# Patient Record
Sex: Female | Born: 1940 | Race: White | Hispanic: No | Marital: Single | Smoking: Never smoker
Health system: Southern US, Community
[De-identification: ages and names within clinical notes are randomized; demographics above are authoritative.]

## PROBLEM LIST (undated history)

## (undated) DIAGNOSIS — E669 Obesity, unspecified: Secondary | ICD-10-CM

## (undated) DIAGNOSIS — I10 Essential (primary) hypertension: Secondary | ICD-10-CM

## (undated) DIAGNOSIS — E119 Type 2 diabetes mellitus without complications: Secondary | ICD-10-CM

## (undated) HISTORY — PX: CATARACT EXTRACTION: SUR2

## (undated) HISTORY — PX: TONSILLECTOMY: SHX5217

## (undated) HISTORY — DX: Essential (primary) hypertension: I10

## (undated) HISTORY — DX: Obesity, unspecified: E66.9

---

## 2007-11-10 ENCOUNTER — Encounter: Payer: Self-pay | Admitting: Internal Medicine

## 2007-12-20 ENCOUNTER — Encounter: Payer: Self-pay | Admitting: Internal Medicine

## 2008-01-03 ENCOUNTER — Telehealth: Payer: Self-pay | Admitting: Internal Medicine

## 2008-01-04 ENCOUNTER — Encounter: Payer: Self-pay | Admitting: Internal Medicine

## 2008-01-04 DIAGNOSIS — J984 Other disorders of lung: Secondary | ICD-10-CM

## 2008-01-19 ENCOUNTER — Encounter: Payer: Self-pay | Admitting: Internal Medicine

## 2008-02-04 DIAGNOSIS — I10 Essential (primary) hypertension: Secondary | ICD-10-CM | POA: Insufficient documentation

## 2008-02-15 ENCOUNTER — Ambulatory Visit: Payer: Self-pay | Admitting: Internal Medicine

## 2008-02-15 DIAGNOSIS — R0602 Shortness of breath: Secondary | ICD-10-CM

## 2008-02-15 DIAGNOSIS — R05 Cough: Secondary | ICD-10-CM

## 2008-03-30 ENCOUNTER — Ambulatory Visit: Payer: Self-pay | Admitting: Internal Medicine

## 2008-05-06 ENCOUNTER — Telehealth: Payer: Self-pay | Admitting: Internal Medicine

## 2009-03-12 ENCOUNTER — Other Ambulatory Visit: Admission: RE | Admit: 2009-03-12 | Discharge: 2009-03-12 | Payer: Self-pay | Admitting: Family Medicine

## 2011-02-18 ENCOUNTER — Ambulatory Visit (INDEPENDENT_AMBULATORY_CARE_PROVIDER_SITE_OTHER): Payer: 59 | Admitting: Internal Medicine

## 2011-02-18 ENCOUNTER — Encounter: Payer: Self-pay | Admitting: Internal Medicine

## 2011-02-18 DIAGNOSIS — J984 Other disorders of lung: Secondary | ICD-10-CM

## 2011-02-18 DIAGNOSIS — R05 Cough: Secondary | ICD-10-CM

## 2011-02-18 DIAGNOSIS — R059 Cough, unspecified: Secondary | ICD-10-CM

## 2011-02-18 DIAGNOSIS — I10 Essential (primary) hypertension: Secondary | ICD-10-CM

## 2011-02-25 NOTE — Assessment & Plan Note (Signed)
Summary: rov ///kp   Visit Type:  Follow-up Copy to:  DR. Darral Dash @ [facility] Primary Provider/Referring Provider:  Dr. Wynelle Link @ [facility]  CC:  Pt last seen 03/2008. Pt states she is still having sob but as bad as before. Pt c/o dry cough and feels it is getting worse and has a tickle in her throat. Sheri Moreno  History of Present Illness: February 18, 2011: Originally seen in March and AProl 2009 for cough and dyspnea (post viral, sinus drainage and ? element of asthma). Cough and dyspnea resolved with stoppingn fish oil, starting symbicort and veramyst. Had normal PFts. aFter that no show. Now returns with c/o cough for  1 year but also on lisinopril for 1 year for bp (was on lisinopril in 2008 and stopping it at time did not help resolve cough per records). Ocurs randomly. Day and night. Mild occ wheeze early in morning on occassion. Made worse by talking. No relieving factors. Denies sinus drainage but feels tickle down throat. Denies dyspnea, hemoptysis, sputum, edema, nausea, vomit, diarrhea.   Preventive Screening-Counseling & Management  Alcohol-Tobacco     Smoking Status: never     Passive Smoke Exposure: yes  Current Medications (verified): 1)  Lisinopril 20 Mg Tabs (Lisinopril) .... Once Daily 2)  Cal-A.e.p. 43.9 Mg  Caps (Calcium) .... Once Daily 3)  Symbicort 160-4.5 Mcg/act  Aero (Budesonide-Formoterol Fumarate) .... Two Times A Day 4)  Ventolin Hfa 108 (90 Base) Mcg/act  Aers (Albuterol Sulfate) .... 2 Puffs Every 4 Hrs As Needed 5)  Osteo Bi-Flex Joint Shield  Tabs (Misc Natural Products) .... Once Daily  Allergies (verified): 1)  ! Penicillin  Past History:  Past medical, surgical, family and social histories (including risk factors) reviewed, and no changes noted (except as noted below).  Past Medical History: Reviewed history from 02/15/2008 and no changes required. cataracts Hypertension Obesity - BMI 30 Cocci Pneumonia - 1985 when moveed to [place]  Past Surgical History: Reviewed  history from 02/15/2008 and no changes required. Cataract Extraction Tonsillectomy Cesarean section Hammer toe repair  Family History: Reviewed history from 02/15/2008 and no changes required. Pancreatic cancer: sister, uncle Stomach Cancer: father Lung cancer: aunt Hypertension: many in family Dementia: mom  Social History: Reviewed history from 02/15/2008 and no changes required. 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 Never smoked. Lives in [place] for past 6 months. Lives with daughter and family in [place]. Goes to [place], [place] when working on [employer] (greater part of last 2 years). Originally moved to [place] in [place]. Passive Smoker - dad and husband.  Review of Systems       The patient complains of shortness of breath with activity, non-productive cough, sore throat, and joint stiffness or pain.  The patient denies shortness of breath at rest, productive cough, coughing up blood, chest pain, irregular heartbeats, acid heartburn, indigestion, loss of appetite, weight change, abdominal pain, difficulty swallowing, tooth/dental problems, headaches, nasal congestion/difficulty breathing through nose, sneezing, itching, ear ache, anxiety, depression, hand/feet swelling, rash, change in color of mucus, and fever.    Vital Signs:  Patient profile:   70 year old female Height:      67 inches Weight:      191.25 pounds BMI:     30.06 O2 Sat:      96 % on Room air Temp:     97.4 degrees F oral Pulse rate:   70 / minute BP sitting:   124 / 78  (  left arm) Cuff size:   regular  Vitals Entered By: Carver Fila (February 18, 2011 1:38 PM)  O2 Flow:  Room air CC: Pt last seen 03/2008. Pt states she is still having sob but as bad as before. Pt c/o dry cough and feels it is getting worse, has a tickle in her throat.  Comments meds and allergies updated Phone number updated [facility]  February 18, 2011 1:42 PM    Physical Exam  General:  obese.   Head:   normocephalic and atraumatic Eyes:  PERRLA/EOM intact; conjunctiva and sclera clear Ears:  TMs intact and clear with normal canals Nose:  no deformity, discharge, inflammation, or lesions Mouth:  no deformity or lesionsMelampatti Class II.   Neck:  no masses, thyromegaly, or abnormal cervical nodes Chest Wall:  no deformities noted Lungs:  clear bilaterally to auscultation and percussion Heart:  regular rate and rhythm, S1, S2 without murmurs, rubs, gallops, or clicks Abdomen:  obese. soft. liver edge palpated.  Msk:  no deformity or scoliosis noted with normal posture Pulses:  pulses normal Extremities:  no clubbing, cyanosis, edema, or deformity noted Neurologic:  CN II-XII grossly intact with normal reflexes, coordination, muscle strength and tone Skin:  intact without lesions or rashes Cervical Nodes:  no significant adenopathy Psych:  alert and cooperative; normal mood and affect; normal attention span and concentration   Impression & Recommendations:  Problem # 1:  COUGH (ICD-786.2) Assessment Deteriorated  plan stop ace inhibitors rov 1 month then reassess for other etiologies  Orders: Prescription Created Electronically [phone]) Est. 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  PLAN re-address at fu  Orders: Prescription Created Electronically [phone]) Est. Patient Level III (91478)  Problem # 3:  HYPERTENSION (ICD-401.9) Assessment: Unchanged change lisinopril to samples of bystolic.    The following medications were removed from the medication list:    Lisinopril 20 Mg Tabs (Lisinopril) ..... Once daily Her updated medication list for this problem includes:    Bystolic 5 Mg Tabs (Nebivolol hcl) .Sheri Moreno... Take 1 tablet daily  Orders: Prescription Created Electronically [phone]) Est. Patient Level III (13086)  Medications Added to Medication List  This Visit: 1)  Lisinopril 20 Mg Tabs (Lisinopril) .... Once daily 2)  Ventolin Hfa 108 (90 Base) Mcg/act Aers (Albuterol sulfate) .... 2 puffs every 4 hrs as needed 3)  Osteo Bi-flex Joint Shield Tabs (Misc natural products) .... Once daily 4)  Bystolic 5 Mg Tabs (Nebivolol hcl) .... Take 1 tablet daily  Patient Instructions: 1)  stop lisinopril 2)  try to break your cough cycle with cough drops or swallowing saliva or drinking water 3)  return in 4 weeks Prescriptions: BYSTOLIC 5 MG TABS (NEBIVOLOL HCL) take 1 tablet daily  #30 x 1   Entered and Authorized by:   Kalman Shan MD   Signed by:   Kalman Shan MD on 02/18/2011   Method used:   Historical   RxID:   [id number]    Orders Added: 1)  Prescription Created Electronically [[id number]] 2)  Est. Patient Level III [44010]    Immunization History:  Influenza Immunization History:    Influenza:  historical (10/08/2010)

## 2011-03-13 ENCOUNTER — Encounter: Payer: Self-pay | Admitting: Adult Health

## 2011-03-17 ENCOUNTER — Encounter: Payer: Self-pay | Admitting: Adult Health

## 2011-03-17 ENCOUNTER — Ambulatory Visit (INDEPENDENT_AMBULATORY_CARE_PROVIDER_SITE_OTHER): Payer: 59 | Admitting: Adult Health

## 2011-03-17 DIAGNOSIS — R05 Cough: Secondary | ICD-10-CM

## 2011-03-17 DIAGNOSIS — J984 Other disorders of lung: Secondary | ICD-10-CM

## 2011-03-17 DIAGNOSIS — R059 Cough, unspecified: Secondary | ICD-10-CM

## 2011-03-17 MED ORDER — NEBIVOLOL HCL 5 MG PO TABS: 5.0000 mg | ORAL_TABLET | Freq: Every day | ORAL | Status: DC

## 2011-03-17 NOTE — Assessment & Plan Note (Addendum)
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  ?on return may be able to decrease symbicort dose or consider d/c.  Plan:   Goal is to stop coughing , throat clearing , Use water , ice chips, NO MINTS.  Avoid ACE inhibitors in future , Place on allergy list.  Mucinex DM Twice daily  For cough As needed   Loratadine daily for drainage As needed   Add Chlor Tabs 4mg  2 At bedtime  Until drainage /cough stops.  Prilosec 20mg  daily before meal x 4 weeks.  If cough returns restart this med until  Cough stops.  Make sure you brush/rinse and gargle after inhaler use.  follow up Dr. Marchelle Gearing in 6 weeks and As needed

## 2011-03-17 NOTE — Progress Notes (Signed)
  Subjective:    Patient ID: Sheri Moreno, female    DOB: 1941/09/22, 70 y.o.   MRN: [id number]  HPI 70 yo female pt of Dr. Marchelle Gearing seen initially for cyclical cough in 2009. Never Smoker.   February 18, 2011: Originally seen in March and AProl 2009 for cough and dyspnea (post viral, sinus drainage and ? element of asthma). Cough and dyspnea resolved with stoppingn fish oil, starting symbicort and veramyst. Had normal PFts. aFter that no show. Now returns with c/o cough for 1 year but also on lisinopril for 1 year for bp (was on lisinopril in 2008 and stopping it at time did not help resolve cough per records). Ocurs randomly. Day and night. Mild occ wheeze early in morning on occassion. Made worse by talking. No relieving factors. Denies sinus drainage but feels tickle down throat.>>Cough flare--ACE stopped, rx for bystolic  03/17/2011 Follow up OV- Pt returns for 1 month follow up of cough. Last visit pt with cough flare felt secondary to ACE inhibitor. Lisinopril was stopped and replaced with Bystolic. Since last ov she is improved with decreased cough. Cough is not totally gone but much better. She does have a lot post nasal drainage esp at night. No overt reflux.   Also has hx of RML 2-3 mm nodule on CT Jan 2009 at [facility]. She is a never smoker. Denies weight loss or hemoptysis. Unclear if she had follow up CT or not.     Review of Systems Constitutional:   No  weight loss, night sweats,  Fevers, chills, fatigue, or  lassitude.  HEENT:   No headaches,  Difficulty swallowing,  Tooth/dental problems, or  Sore throat,                CV:  No chest pain,  Orthopnea, PND, swelling in lower extremities, anasarca, dizziness, palpitations, syncope.   GI  No heartburn, indigestion, abdominal pain, nausea, vomiting, diarrhea, change in bowel habits, loss of appetite, bloody stools.   Resp: No shortness of breath with exertion or at rest.  No excess mucus, no productive cough,  No non-productive  cough,  No coughing up of blood.  No change in color of mucus.  No wheezing.  No chest wall deformity  Skin: no rash or lesions.  GU: no dysuria, change in color of urine, no urgency or frequency.  No flank pain, no hematuria   MS:  No joint pain or swelling.  No decreased range of motion.  No back pain.  Psych:  No change in mood or affect. No depression or anxiety.  No memory loss.    Objective:   Physical Exam GEN: A/Ox3; pleasant , NAD, well nourished   HEENT:  [place]/AT,  EACs-clear, TMs-wnl, NOSE-clear, THROAT-clear, no lesions, clear postnasal drip   NECK:  Supple w/ fair ROM; no JVD; normal carotid impulses w/o bruits; no thyromegaly or nodules palpated; no lymphadenopathy.  RESP  Clear  P & A; w/o, wheezes/ rales/ or rhonchi.no accessory muscle use, no dullness to percussion  CARD:  RRR, no m/r/g  , no peripheral edema, pulses intact, no cyanosis or clubbing.  GI:   Soft & nt; nml bowel sounds; no organomegaly or masses detected.  Musco: Warm bil, no deformities or joint swelling noted.   Neuro: alert, no focal deficits noted.    Skin: Warm, no lesions or rashes      Assessment & Plan:

## 2011-03-17 NOTE — Patient Instructions (Signed)
We think you have a cyclical cough that is aggravated by ACE inhibitors, drainage and reflux.  Goal is to stop coughing , throat clearing , Use water , ice chips, NO MINTS.  Avoid ACE inhibitors in future , Place on allergy list.  Mucinex DM Twice daily  For cough As needed   Loratadine daily for drainage As needed   Add Chlor Tabs 4mg  2 At bedtime  Until drainage /cough stops.  Prilosec 20mg  daily before meal x 4 weeks.  If cough returns restart this med until  Cough stops.  Make sure you brush/rinse and gargle after inhaler use.  follow up Dr. Marchelle Gearing in 6 weeks and As needed

## 2011-03-17 NOTE — Assessment & Plan Note (Signed)
Will discuss with Dr. Marchelle Gearing if he reviewed previous CT films in past  Informed pt we will call her or discuss at return ov if she needs this repeated.

## 2011-09-17 ENCOUNTER — Ambulatory Visit: Payer: 59 | Admitting: Internal Medicine

## 2011-09-29 ENCOUNTER — Encounter: Payer: Self-pay | Admitting: Internal Medicine

## 2011-09-29 ENCOUNTER — Ambulatory Visit (INDEPENDENT_AMBULATORY_CARE_PROVIDER_SITE_OTHER): Payer: Medicare Other | Admitting: Internal Medicine

## 2011-09-29 DIAGNOSIS — R0602 Shortness of breath: Secondary | ICD-10-CM

## 2011-09-29 DIAGNOSIS — R911 Solitary pulmonary nodule: Secondary | ICD-10-CM

## 2011-09-29 DIAGNOSIS — R05 Cough: Secondary | ICD-10-CM

## 2011-09-29 DIAGNOSIS — J984 Other disorders of lung: Secondary | ICD-10-CM

## 2011-09-29 NOTE — Assessment & Plan Note (Signed)
#  Shortness of breath - unclear etiology: ? Deconditoning, ? Obesity related, ? True desaturation  - unclear if you are dropping oxygen or not - please have full PFT breathing test - will reviwe CT chest being done for nodule; will get it as HRCT chest  - repeat walking desaturation test at next visit  #followup  - in 3 weeks or so with CT chest and PFT results

## 2011-09-29 NOTE — Patient Instructions (Signed)
#  pulmonary nodule  - cancer risk is low but due to prior nodule and passive smoking hx: please do High res. CT chest without contrast - fu lung nodule (prior CT with [facility] 2009)  #Cough - regarding cough: please stop symbicort  - I would like to see results of breathing test and ct chest before we delve further into cough  #Shortness of breath  - unclear if you are dropping oxygen or not - please have full PFT breathing test  - repeat walking desaturation test at next visit  #followup  - in 3 weeks or so with CT chest and PFT results

## 2011-09-29 NOTE — Progress Notes (Signed)
Subjective:    Patient ID: Sheri Moreno, female    DOB: 1941-02-17, 70 y.o.   MRN: [id number]  HPI 70 yo female pt of Dr. Marchelle Gearing seen initially for cyclical cough in 2009. Never Smoker.   February 18, 2011: Originally seen in March and AProl 2009 for cough and dyspnea (post viral, sinus drainage and ? element of asthma). Cough and dyspnea resolved with stoppingn fish oil, starting symbicort and veramyst. Had normal PFts. aFter that no show. Now returns with c/o cough for 1 year but also on lisinopril for 1 year for bp (was on lisinopril in 2008 and stopping it at time did not help resolve cough per records). Ocurs randomly. Day and night. Mild occ wheeze early in morning on occassion. Made worse by talking. No relieving factors. Denies sinus drainage but feels tickle down throat.>>Cough flare--ACE stopped, rx for bystolic  03/17/2011 Follow up OV- Pt returns for 1 month follow up of cough. Last visit pt with cough flare felt secondary to ACE inhibitor. Lisinopril was stopped and replaced with Bystolic. Since last ov she is improved with decreased cough. Cough is not totally gone but much better. She does have a lot post nasal drainage esp at night. No overt reflux. Also has hx of RML 2-3 mm nodule on CT Jan 2009 at [facility]. She is a never smoker. Denies weight loss or hemoptysis. Unclear if she had follow up CT or not.   REC We think you have a cyclical cough that is aggravated by ACE inhibitors, drainage and reflux.  Goal is to stop coughing , throat clearing , Use water , ice chips, NO MINTS.  Avoid ACE inhibitors in future , Place on allergy list.  Mucinex DM Twice daily For cough As needed  Loratadine daily for drainage As needed  Add Chlor Tabs 4mg  2 At bedtime Until drainage /cough stops.  Prilosec 20mg  daily before meal x 4 weeks. If cough returns restart this med until Cough stops.  Make sure you brush/rinse and gargle after inhaler use.  follow up Dr. Marchelle Gearing in 6 weeks and As needed     OV 09/29/11: Followup cough and pulm nodules. States that after stopping ace inhibitor 6 months ago cough improved 80%. LEft with residual cough. RSI score currently for this residual cough is 19 of 45 (c/w LPR). Reports moderate hoarsness of voice, excess mucus in throat/post nasal drop, mild-moderate coughing after lying down and difficulty swallowing and mild sensation of lump in throat, clearing of throat, choking episodes. 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 Cough improved by not talking. Not sure she is following sinus and GERD advice of last visit but still using symbicort which was originally started  For cough that developed post lisinopril.   In terms of nodule, she is a passive smoker and feels she needs a repeat ct chest  This visit, reporting new dyspnea. Insidipous onset few years ago with cough. With cough improvement dyspnea did not imprved. Exertional for moderate activities. Relieved by rest. Unable to participate playing with grandkids. Pulse ox 94% at PMD office and she is worried this is low. Denies associated chest pain. Rates dyspnea as mild-moderate. Walked her in office and she dropped to 88% at 2nd lap (185 feet  = 1 lap)  Past, Family, Social: reviewd, no changes noted   Review of Systems  Constitutional: Negative for fever and unexpected weight change.  HENT: Positive  for voice change. Negative for ear pain, nosebleeds, congestion, sore throat, rhinorrhea, sneezing, trouble swallowing, dental problem, postnasal drip and sinus pressure.   Eyes: Negative for redness and itching.  Respiratory: Positive for shortness of breath. Negative for cough, chest tightness and wheezing.   Cardiovascular: Negative for palpitations and leg swelling.  Gastrointestinal: Negative for nausea and vomiting.  Genitourinary: Negative for dysuria.  Musculoskeletal: Negative for joint swelling.  Skin: Negative  for rash.  Neurological: Negative for headaches.  Hematological: Does not bruise/bleed easily.  Psychiatric/Behavioral: Negative for dysphoric mood. The patient is not nervous/anxious.        Objective:   Physical Exam  Vitals reviewed. Constitutional: She is oriented to person, place, and time. She appears well-developed and well-nourished. No distress.       overweight  HENT:  Head: Normocephalic and atraumatic.  Right Ear: External ear normal.  Left Ear: External ear normal.  Mouth/Throat: Oropharynx is clear and moist. No oropharyngeal exudate.       Voice squeaks as she speaks  Eyes: Conjunctivae and EOM are normal. Pupils are equal, round, and reactive to light. Right eye exhibits no discharge. Left eye exhibits no discharge. No scleral icterus.  Neck: Normal range of motion. Neck supple. No JVD present. No tracheal deviation present. No thyromegaly present.  Cardiovascular: Normal rate, regular rhythm, normal heart sounds and intact distal pulses.  Exam reveals no gallop and no friction rub.   No murmur heard. Pulmonary/Chest: Effort normal and breath sounds normal. No respiratory distress. She has no wheezes. She has no rales. She exhibits no tenderness.  Abdominal: Soft. Bowel sounds are normal. She exhibits no distension and no mass. There is no tenderness. There is no rebound and no guarding.  Musculoskeletal: Normal range of motion. She exhibits no edema and no tenderness.  Lymphadenopathy:    She has no cervical adenopathy.  Neurological: She is alert and oriented to person, place, and time. She has normal reflexes. No cranial nerve deficit. She exhibits normal muscle tone. Coordination normal.  Skin: Skin is warm and dry. No rash noted. She is not diaphoretic. No erythema. No pallor.  Psychiatric: She has a normal mood and affect. Her behavior is normal. Judgment and thought content normal.         Assessment & Plan:

## 2011-09-29 NOTE — Assessment & Plan Note (Signed)
#  pulmonary nodule  - cancer risk is low but due to prior nodule and passive smoking hx: please do High res. CT chest without contrast - fu lung nodule (prior CT with [facility] 2009)

## 2011-09-29 NOTE — Assessment & Plan Note (Signed)
#  Cough - regarding cough: please stop symbicort  - I would like to see results of breathing test and ct chest before we delve further into cough though RSI score 19 and voice changes all suggest LPR cough. However, the desaturation with exertion behooves that one investigates PFT and CT chest first

## 2011-10-01 ENCOUNTER — Ambulatory Visit (INDEPENDENT_AMBULATORY_CARE_PROVIDER_SITE_OTHER)
Admission: RE | Admit: 2011-10-01 | Discharge: 2011-10-01 | Disposition: A | Discharge status: Home / Self Care | Payer: Medicare Other | Source: Ambulatory Visit | Attending: Internal Medicine | Admitting: Internal Medicine

## 2011-10-01 DIAGNOSIS — R0602 Shortness of breath: Secondary | ICD-10-CM

## 2011-10-13 ENCOUNTER — Ambulatory Visit (INDEPENDENT_AMBULATORY_CARE_PROVIDER_SITE_OTHER): Payer: Medicare Other | Admitting: Internal Medicine

## 2011-10-13 DIAGNOSIS — R0602 Shortness of breath: Secondary | ICD-10-CM

## 2011-10-13 NOTE — Progress Notes (Signed)
PFT done today. 

## 2011-10-21 ENCOUNTER — Telehealth: Payer: Self-pay | Admitting: Internal Medicine

## 2011-10-21 NOTE — Telephone Encounter (Signed)
LMTCBx1.[name], CMA  

## 2011-10-21 NOTE — Telephone Encounter (Signed)
Sheri Moreno,  Please let patient know that November 2012 pulmonary function test is normal. Also, has she dropped off her 2009 CAT scan of the chest from [facility] radiology. She was supposed to do that. Let me know.  Thanks,  MR

## 2011-10-23 NOTE — Telephone Encounter (Signed)
I spoke with the pt and advised of results. She has not gotten the CD-rom yet. She states she is out of town and will be back next week.  Carron Curie, CMA

## 2011-11-25 ENCOUNTER — Ambulatory Visit: Payer: Medicare Other | Admitting: Internal Medicine

## 2011-12-01 ENCOUNTER — Telehealth: Payer: Self-pay | Admitting: *Deleted

## 2011-12-01 NOTE — Telephone Encounter (Signed)
I spoke with the patient and states she will have to call back to r/s because she works out of town every other week and is not sure of her Jan schedule. Carron Curie, CMA

## 2011-12-01 NOTE — Telephone Encounter (Signed)
LMTCBx1 to r/s pt appt on 12-04-11 due to doctor canceling office. When pt returns call please see Tammy Earlene Plater or Victorino Dike. If they are not in the office then send call to Lawson Fiscal, she is also aware of available dates. Thanks. Carron Curie, CMA

## 2011-12-04 ENCOUNTER — Ambulatory Visit: Payer: Medicare Other | Admitting: Internal Medicine

## 2011-12-15 ENCOUNTER — Telehealth: Payer: Self-pay | Admitting: Allergy

## 2011-12-15 NOTE — Telephone Encounter (Signed)
[facility] Requesting rx  bystolic 5 mg # 30 Last fill 10-09-2011 Allergies  Allergen Reactions  . Ace Inhibitors Cough  . Penicillins Hives and Rash   Tammy is this ok to refll?   Thanks

## 2011-12-16 MED ORDER — NEBIVOLOL HCL 5 MG PO TABS: 5.0000 mg | ORAL_TABLET | Freq: Every day | ORAL | Status: DC

## 2011-12-16 NOTE — Telephone Encounter (Signed)
She can have 1 refill but she missed her ov and was suppose to have follow up to review CT scan results and follow up -please set this up with Dr. Marchelle Gearing  thanx tam

## 2011-12-16 NOTE — Telephone Encounter (Signed)
lmomtcb x1 for pt rx sent 

## 2011-12-30 ENCOUNTER — Telehealth: Payer: Self-pay | Admitting: Internal Medicine

## 2011-12-30 NOTE — Telephone Encounter (Signed)
I spoke with pt and she states she still has not heard from MR regarding her CT she dropped off 3 weeks ago as he requested. Pt states this was suppose to be compared to the 1 she had a year ago. She states also needs a statement from MR stating she does not have TB. She either wants MR's analysis of the CT or the cd-rom sent to her pcp for their analysis's. Please advise MR, thanks

## 2011-12-31 NOTE — Telephone Encounter (Signed)
Forwardin to [employer]. We need to search for CT

## 2011-12-31 NOTE — Telephone Encounter (Signed)
Triage,  I am sending this message to Carron Curie along with old message tha I hhave kept open since mid nov 2012 waiting for her CT that I do not see in my bag or office search today  Thanks MR

## 2012-01-01 NOTE — Telephone Encounter (Signed)
See phone note from 10-21-11. Carron Curie, CMA

## 2012-01-01 NOTE — Telephone Encounter (Signed)
MR does not have the CD-Rom. I cannot locate it either. I called [facility] in med rec and it has not been sent to be filed.  I called [facility] to see if they can re-send CD-rom to me. They have to check old system and call me back. I have also LMTCBx1 with the pt. Carron Curie, CMA

## 2012-01-01 NOTE — Telephone Encounter (Signed)
I spoke with TJ at [facility] radiology and she is mailing the cd-rom to my attention.  I LMTCBx1 to advise the pt.Jennifer Yancey Flemings, CMA

## 2012-01-05 NOTE — Telephone Encounter (Signed)
CD-ROm given to MR. Also the pt states she needs a letter stating that she does not have TB for her work. She states her PPD always shows positive so she needs to have a doctors letter stating she does not have the disease. Please advise. Carron Curie, CMA

## 2012-01-07 ENCOUNTER — Telehealth: Payer: Self-pay | Admitting: Internal Medicine

## 2012-01-07 DIAGNOSIS — R911 Solitary pulmonary nodule: Secondary | ICD-10-CM

## 2012-01-07 NOTE — Telephone Encounter (Signed)
Pt is requesting the results of the CT that she brought by. Have you looked at these yet? Please advise. Carron Curie, CMA

## 2012-01-07 NOTE — Telephone Encounter (Signed)
Reviewed ct scan of chest from 01/04/2008 from [facility] and compared to October 01, 2011 ct in our system. Much of it is same esp some of the scarring and LUL apical subpleural nodules. One spot in RML in 2009 is now resolved. Only new one now in Oct 2012 is RLL subpleural ground glass opacity. This is small. Needs fu CT without contrast in October 2013- I ordered it  Gave result over phone to patient  This is NOT TUBERCULOSIS. Please do a letter saying patient does not have pulmonary  tuberculosis based on history, physical exam and ct scan chest findings from 2009 Jan and 2012 October. Please send letter to patient  Ensure fu based on prior OV note

## 2012-01-09 ENCOUNTER — Encounter: Payer: Self-pay | Admitting: *Deleted

## 2012-01-09 ENCOUNTER — Ambulatory Visit: Payer: Medicare Other | Admitting: Internal Medicine

## 2012-01-09 NOTE — Telephone Encounter (Signed)
Letter done and per pt request it was mailed to her and faxed to her PCP. Carron Curie, CMA

## 2012-04-08 ENCOUNTER — Other Ambulatory Visit: Payer: Self-pay | Admitting: Adult Health

## 2012-06-09 DIAGNOSIS — I10 Essential (primary) hypertension: Secondary | ICD-10-CM | POA: Diagnosis not present

## 2012-06-09 DIAGNOSIS — Z1331 Encounter for screening for depression: Secondary | ICD-10-CM | POA: Diagnosis not present

## 2012-08-03 DIAGNOSIS — H26499 Other secondary cataract, unspecified eye: Secondary | ICD-10-CM | POA: Diagnosis not present

## 2012-09-23 DIAGNOSIS — H26499 Other secondary cataract, unspecified eye: Secondary | ICD-10-CM | POA: Diagnosis not present

## 2012-09-30 ENCOUNTER — Other Ambulatory Visit: Payer: Medicare Other

## 2012-10-15 DIAGNOSIS — Z23 Encounter for immunization: Secondary | ICD-10-CM | POA: Diagnosis not present

## 2012-11-03 DIAGNOSIS — H8110 Benign paroxysmal vertigo, unspecified ear: Secondary | ICD-10-CM | POA: Diagnosis not present

## 2012-11-03 DIAGNOSIS — H903 Sensorineural hearing loss, bilateral: Secondary | ICD-10-CM | POA: Diagnosis not present

## 2012-11-03 DIAGNOSIS — R42 Dizziness and giddiness: Secondary | ICD-10-CM | POA: Diagnosis not present

## 2012-11-23 ENCOUNTER — Telehealth: Payer: Self-pay | Admitting: Internal Medicine

## 2012-11-23 DIAGNOSIS — R911 Solitary pulmonary nodule: Secondary | ICD-10-CM

## 2012-11-23 NOTE — Telephone Encounter (Signed)
lmtcb x1 pt has not been seen since 09/29/11

## 2012-11-24 NOTE — Telephone Encounter (Signed)
MR, please advise if you would like [place] to set this up prior to patient seeing you in 12-2012. Thanks.

## 2012-11-25 NOTE — Telephone Encounter (Signed)
Pr phone note from 12-2011 MR recs were as follows: RAMASWAMY,MURALI, MD 01/07/2012 1:54 PM Signed  Reviewed ct scan of chest from 01/04/2008 [facility] and compared to October 01, 2011 ct in our system. Much of it is same esp some of the scarring and LUL apical subpleural nodules. One spot in RML in 2009 is now resolved. Only new one now in Oct 2012 is RLL subpleural ground glass opacity. This is small. Needs fu CT without contrast in October 2013- I ordered it    So pt does need CT before appt. CT ordered. Pt is aware.Carron Curie, CMA

## 2012-11-29 NOTE — Telephone Encounter (Signed)
Yes, see her wit fu ct

## 2012-12-24 ENCOUNTER — Inpatient Hospital Stay: Admission: RE | Admit: 2012-12-24 | Payer: Medicare Other | Source: Ambulatory Visit

## 2012-12-28 ENCOUNTER — Ambulatory Visit: Payer: Medicare Other | Admitting: Internal Medicine

## 2013-01-07 ENCOUNTER — Telehealth: Payer: Self-pay | Admitting: Internal Medicine

## 2013-01-07 NOTE — Telephone Encounter (Signed)
I spoke with pt and she had a message to call MR'S nurse. I do not see anything in EPIC. Please advise Sheri Moreno if you called pt thanks

## 2013-01-10 DIAGNOSIS — R7301 Impaired fasting glucose: Secondary | ICD-10-CM | POA: Diagnosis not present

## 2013-01-10 DIAGNOSIS — I10 Essential (primary) hypertension: Secondary | ICD-10-CM | POA: Diagnosis not present

## 2013-01-11 ENCOUNTER — Encounter: Payer: Self-pay | Admitting: *Deleted

## 2013-01-11 ENCOUNTER — Encounter: Payer: Self-pay | Admitting: Internal Medicine

## 2013-01-11 ENCOUNTER — Ambulatory Visit (INDEPENDENT_AMBULATORY_CARE_PROVIDER_SITE_OTHER)
Admission: RE | Admit: 2013-01-11 | Discharge: 2013-01-11 | Disposition: A | Discharge status: Home / Self Care | Payer: Managed Care, Other (non HMO) | Source: Ambulatory Visit | Attending: Internal Medicine | Admitting: Internal Medicine

## 2013-01-11 ENCOUNTER — Ambulatory Visit (INDEPENDENT_AMBULATORY_CARE_PROVIDER_SITE_OTHER): Payer: Managed Care, Other (non HMO) | Admitting: Internal Medicine

## 2013-01-11 VITALS — BP 120/78 | HR 65 | Temp 97.8°F | Ht 67.0 in | Wt 204.8 lb

## 2013-01-11 DIAGNOSIS — R05 Cough: Secondary | ICD-10-CM | POA: Diagnosis not present

## 2013-01-11 DIAGNOSIS — J984 Other disorders of lung: Secondary | ICD-10-CM

## 2013-01-11 DIAGNOSIS — R911 Solitary pulmonary nodule: Secondary | ICD-10-CM

## 2013-01-11 DIAGNOSIS — R0602 Shortness of breath: Secondary | ICD-10-CM | POA: Diagnosis not present

## 2013-01-11 DIAGNOSIS — R7611 Nonspecific reaction to tuberculin skin test without active tuberculosis: Secondary | ICD-10-CM | POA: Diagnosis not present

## 2013-01-11 NOTE — Patient Instructions (Addendum)
#  Shortness of breath  - probably due to weight . This is mild. Work on your fitness  #lung nodules  - stable since 2009 and 2012. No further followup  #Cough  - glad it resolved. Was due to ace inhibitor allergy. No further followup  #Followup  - no further followup

## 2013-01-11 NOTE — Assessment & Plan Note (Signed)
No longer an issue after she stopped her ACE inhibitors

## 2013-01-11 NOTE — Progress Notes (Signed)
Subjective:    Patient ID: Sheri Moreno, female    DOB: 10/17/1941, 72 y.o.   MRN: [id number]  HPI 72 yo female pt of Dr. Marchelle Gearing seen initially for cyclical cough in 2009. Never Smoker.   February 18, 2011: Originally seen in March and AProl 2009 for cough and dyspnea (post viral, sinus drainage and ? element of asthma). Cough and dyspnea resolved with stoppingn fish oil, starting symbicort and veramyst. Had normal PFts. aFter that no show. Now returns with c/o cough for 1 year but also on lisinopril for 1 year for bp (was on lisinopril in 2008 and stopping it at time did not help resolve cough per records). Ocurs randomly. Day and night. Mild occ wheeze early in morning on occassion. Made worse by talking. No relieving factors. Denies sinus drainage but feels tickle down throat.>>Cough flare--ACE stopped, rx for bystolic  03/17/2011 Follow up OV- Pt returns for 1 month follow up of cough. Last visit pt with cough flare felt secondary to ACE inhibitor. Lisinopril was stopped and replaced with Bystolic. Since last ov she is improved with decreased cough. Cough is not totally gone but much better. She does have a lot post nasal drainage esp at night. No overt reflux. Also has hx of RML 2-3 mm nodule on CT Jan 2009 at [facility]. She is a never smoker. Denies weight loss or hemoptysis. Unclear if she had follow up CT or not.   REC We think you have a cyclical cough that is aggravated by ACE inhibitors, drainage and reflux.  Goal is to stop coughing , throat clearing , Use water , ice chips, NO MINTS.  Avoid ACE inhibitors in future , Place on allergy list.  Mucinex DM Twice daily For cough As needed  Loratadine daily for drainage As needed  Add Chlor Tabs 4mg  2 At bedtime Until drainage /cough stops.  Prilosec 20mg  daily before meal x 4 weeks. If cough returns restart this med until Cough stops.  Make sure you brush/rinse and gargle after inhaler use.  follow up Dr. Marchelle Gearing in 6 weeks and As needed    OV 09/29/11: Followup cough and pulm nodules. States that after stopping ace inhibitor 6 months ago cough improved 80%. LEft with residual cough. RSI score currently for this residual cough is 19 of 45 (c/w LPR). Reports moderate hoarsness of voice, excess mucus in throat/post nasal drop, mild-moderate coughing after lying down and difficulty swallowing and mild sensation of lump in throat, clearing of throat, choking episodes. 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 Cough improved by not talking. Not sure she is following sinus and GERD advice of last visit but still using symbicort which was originally started  For cough that developed post lisinopril.   In terms of nodule, she is a passive smoker and feels she needs a repeat ct chest  This visit, reporting new dyspnea. Insidipous onset few years ago with cough. With cough improvement dyspnea did not imprved. Exertional for moderate activities. Relieved by rest. Unable to participate playing with grandkids. Pulse ox 94% at PMD office and she is worried this is low. Denies associated chest pain. Rates dyspnea as mild-moderate. Walked her in office and she dropped to 88% at 2nd lap (185 feet  = 1 lap)  Past, Family, Social: reviewd, no changes noted  #pulmonary nodule  - cancer risk is low but due to prior nodule and passive smoking hx:  please do High res. CT chest without contrast - fu lung nodule (prior CT with [facility] 2009)  #Cough  - regarding cough: please stop symbicort  - I would like to see results of breathing test and ct chest before we delve further into cough  #Shortness of breath  - unclear if you are dropping oxygen or not  - please have full PFT breathing test  - repeat walking desaturation test at next visit  #followup  - in 3 weeks or so with CT chest and PFT results   OV 01/11/2013  Fu after long time   - cough: resolved. Not an issue anymore.  Stopped aftrer she stopped ace inhibitor in 2012. Not on any kind of ongoing Rx for cough including nasal steroid or ics  - dyspnea: still present and presenbt for 8 years. Stable. Mild. Unchanged.  She does not see it as a problem. She sees it as part of normal ageing. She notices it on treadmill walk after 20 min or after rushing through airport. Marland Kitchen Dyspnea resolves at rest. No asscoiated chest pain. No wheezeing. Does not impact quality of life or work. ng. Her Body mass index is 32.08 kg/(m^2). Her PFT nov 2012 was normal with dlco 76%  - Lung nodule:  [place] no change in nodules OCt 2012 to 01/11/2013 x 16 months with largest nodule at 5mm. Te 2012 cone scan largely unchanged from [facility] ( Much of it is same esp some of the scarring and LUL apical subpleural nodules. One spot in RML in 2009 is now resolved. Only new one now in Oct 2012 is RLL subpleural ground glass opacity)    Ct Chest Wo Contrast  01/11/2013  *RADIOLOGY REPORT*  Clinical Data: Lung nodule.  Positive PPD.  CT CHEST WITHOUT CONTRAST  Technique:  Multidetector CT imaging of the chest was performed following the standard protocol without IV contrast.  Comparison: 10/01/2011  Findings: No thoracic adenopathy observed.  Calcified mitral valve noted.  No cardiomegaly.  The 0.5 x 0.4 cm right upper lobe pulmonary nodule appears stable.  On image 19 of series 3. Scarring anteriorly in the right upper lobe with adjacent nodularity likewise appears stable.  There is a 3 mm right middle lobe pulmonary nodule on image 34 of series 3 which appears stable.  Several additional right middle lobe small pulmonary nodules appear stable.  In the right lower lobe, a 0.5 x 0.4 cm nodule on image 33 of series 3 is unchanged.  A 3 mm nodule in the right lower lobe on image 41 of series 3 is likewise unchanged.  In the left upper lobe, a subpleural 6 mm nodule is partially calcified and unchanged.  Faint nodularity further  inferiorly in the left upper lobe appears stable, as does the mild lingular scarring.  Small left lower lobe nodules include a 4 mm nodule in the superior segment left lower lobe as well as several additional tiny nodules, all of which appears similar to prior.  This includes a somewhat clover shaped 4 mm nodule in the left lower lobe on image 39 of series 3.  No new nodules identified.  IMPRESSION:  1.  Bilateral pulmonary nodules are unchanged over the intervening 16 months. If the patient is not low risk for lung cancer, then no further imaging follow-up is required.  If the patient is at high risk for lung cancer, then follow-up CT chest in one year's time would be recommended.  This recommendation follows the consensus statement: Guidelines for Management of Small Pulmonary Nodules Detected on CT Scans: A Statement from the Fleischner Society as published in Radiology 2005; 237:395-400. 2.  Calcified mitral valve.   Original Report Authenticated By: Gaylyn Rong, M.D.        Review of Systems  Constitutional: Negative for fever and unexpected weight change.  HENT: Negative for ear pain, nosebleeds, congestion, sore throat, rhinorrhea, sneezing, trouble swallowing, dental problem, postnasal drip and sinus pressure.   Eyes: Negative for redness and itching.  Respiratory: Positive for shortness of breath. Negative for cough, chest tightness and wheezing.   Cardiovascular: Negative for palpitations and leg swelling.  Gastrointestinal: Negative for nausea and vomiting.  Genitourinary: Negative for dysuria.  Musculoskeletal: Negative for joint swelling.  Skin: Negative for rash.  Neurological: Negative for headaches.  Hematological: Does not bruise/bleed easily.  Psychiatric/Behavioral: Negative for dysphoric mood. The patient is not nervous/anxious.        Objective:   Physical Exam Vitals reviewed. Constitutional: She is oriented to person, place, and time. She appears well-developed  and well-nourished. No distress.       overweight  HENT:  Head: Normocephalic and atraumatic.  Right Ear: External ear normal.  Left Ear: External ear normal.  Mouth/Throat: Oropharynx is clear and moist. No oropharyngeal exudate.         Eyes: Conjunctivae and EOM are normal. Pupils are equal, round, and reactive to light. Right eye exhibits no discharge. Left eye exhibits no discharge. No scleral icterus.  Neck: Normal range of motion. Neck supple. No JVD present. No tracheal deviation present. No thyromegaly present.  Cardiovascular: Normal rate, regular rhythm, normal heart sounds and intact distal pulses.  Exam reveals no gallop and no friction rub.   No murmur heard. Pulmonary/Chest: Effort normal and breath sounds normal. No respiratory distress. She has no wheezes. She has no rales. She exhibits no tenderness.  Abdominal: Soft. Bowel sounds are normal. She exhibits no distension and no mass. There is no tenderness. There is no rebound and no guarding.  Musculoskeletal: Normal range of motion. She exhibits no edema and no tenderness.  Lymphadenopathy:    She has no cervical adenopathy.  Neurological: She is alert and oriented to person, place, and time. She has normal reflexes. No cranial nerve deficit. She exhibits normal muscle tone. Coordination normal.  Skin: Skin is warm and dry. No rash noted. She is not diaphoretic. No erythema. No pallor.  Psychiatric: She has a normal mood and affect. Her behavior is normal. Judgment and thought content normal.         Assessment & Plan:

## 2013-01-11 NOTE — Assessment & Plan Note (Signed)
RML 2-3 mm nodule on CT Jan 2009 at [facility].>> Some 5 mm nodule oct 2012 unchanged Unchangd nodules Feb 2014  nO FURTHER followup because she is non-smoker

## 2013-01-11 NOTE — Assessment & Plan Note (Signed)
Is mild and stable for many years. Appears related to obesity and deconditioning asked her to work on a fitness. PFT November 2012t and test and CT scan are essentially normal and do not indicate any pulmonary issues for dyspnea

## 2013-01-12 DIAGNOSIS — H40009 Preglaucoma, unspecified, unspecified eye: Secondary | ICD-10-CM | POA: Diagnosis not present

## 2013-01-12 NOTE — Telephone Encounter (Signed)
We saw the pt on 01-11-13, nothing further needed.Carron Curie, CMA

## 2013-02-14 ENCOUNTER — Other Ambulatory Visit: Payer: Self-pay

## 2013-02-14 DIAGNOSIS — Z1231 Encounter for screening mammogram for malignant neoplasm of breast: Secondary | ICD-10-CM

## 2013-03-07 DIAGNOSIS — R7301 Impaired fasting glucose: Secondary | ICD-10-CM | POA: Diagnosis not present

## 2013-03-07 DIAGNOSIS — E7800 Pure hypercholesterolemia, unspecified: Secondary | ICD-10-CM | POA: Diagnosis not present

## 2013-03-09 ENCOUNTER — Ambulatory Visit
Admission: RE | Admit: 2013-03-09 | Discharge: 2013-03-09 | Disposition: A | Discharge status: Home / Self Care | Payer: Managed Care, Other (non HMO) | Source: Ambulatory Visit

## 2013-03-09 DIAGNOSIS — Z1231 Encounter for screening mammogram for malignant neoplasm of breast: Secondary | ICD-10-CM

## 2013-03-22 ENCOUNTER — Other Ambulatory Visit: Payer: Self-pay | Admitting: Family Medicine

## 2013-03-22 DIAGNOSIS — R928 Other abnormal and inconclusive findings on diagnostic imaging of breast: Secondary | ICD-10-CM

## 2013-04-18 ENCOUNTER — Ambulatory Visit
Admission: RE | Admit: 2013-04-18 | Discharge: 2013-04-18 | Disposition: A | Discharge status: Home / Self Care | Payer: Managed Care, Other (non HMO) | Source: Ambulatory Visit | Attending: Family Medicine | Admitting: Family Medicine

## 2013-04-18 DIAGNOSIS — R928 Other abnormal and inconclusive findings on diagnostic imaging of breast: Secondary | ICD-10-CM

## 2013-04-18 DIAGNOSIS — H40009 Preglaucoma, unspecified, unspecified eye: Secondary | ICD-10-CM | POA: Diagnosis not present

## 2013-04-21 DIAGNOSIS — K620 Anal polyp: Secondary | ICD-10-CM | POA: Diagnosis not present

## 2013-04-21 DIAGNOSIS — K621 Rectal polyp: Secondary | ICD-10-CM | POA: Diagnosis not present

## 2013-07-11 DIAGNOSIS — R7301 Impaired fasting glucose: Secondary | ICD-10-CM | POA: Diagnosis not present

## 2013-07-11 DIAGNOSIS — B351 Tinea unguium: Secondary | ICD-10-CM | POA: Diagnosis not present

## 2013-07-11 DIAGNOSIS — R197 Diarrhea, unspecified: Secondary | ICD-10-CM | POA: Diagnosis not present

## 2013-07-11 DIAGNOSIS — I10 Essential (primary) hypertension: Secondary | ICD-10-CM | POA: Diagnosis not present

## 2014-01-09 DIAGNOSIS — B351 Tinea unguium: Secondary | ICD-10-CM | POA: Diagnosis not present

## 2014-01-09 DIAGNOSIS — I10 Essential (primary) hypertension: Secondary | ICD-10-CM | POA: Diagnosis not present

## 2014-01-09 DIAGNOSIS — Z1331 Encounter for screening for depression: Secondary | ICD-10-CM | POA: Diagnosis not present

## 2014-01-09 DIAGNOSIS — L600 Ingrowing nail: Secondary | ICD-10-CM | POA: Diagnosis not present

## 2014-01-09 DIAGNOSIS — E782 Mixed hyperlipidemia: Secondary | ICD-10-CM | POA: Diagnosis not present

## 2014-01-23 DIAGNOSIS — M79609 Pain in unspecified limb: Secondary | ICD-10-CM | POA: Diagnosis not present

## 2014-01-27 DIAGNOSIS — L03039 Cellulitis of unspecified toe: Secondary | ICD-10-CM | POA: Diagnosis not present

## 2014-01-27 DIAGNOSIS — L02619 Cutaneous abscess of unspecified foot: Secondary | ICD-10-CM | POA: Diagnosis not present

## 2014-02-06 DIAGNOSIS — L259 Unspecified contact dermatitis, unspecified cause: Secondary | ICD-10-CM | POA: Diagnosis not present

## 2014-02-09 DIAGNOSIS — L259 Unspecified contact dermatitis, unspecified cause: Secondary | ICD-10-CM | POA: Diagnosis not present

## 2014-02-13 DIAGNOSIS — T8189XA Other complications of procedures, not elsewhere classified, initial encounter: Secondary | ICD-10-CM | POA: Diagnosis not present

## 2014-04-03 DIAGNOSIS — K921 Melena: Secondary | ICD-10-CM | POA: Diagnosis not present

## 2014-04-03 DIAGNOSIS — R197 Diarrhea, unspecified: Secondary | ICD-10-CM | POA: Diagnosis not present

## 2014-04-03 DIAGNOSIS — K649 Unspecified hemorrhoids: Secondary | ICD-10-CM | POA: Diagnosis not present

## 2014-04-04 DIAGNOSIS — R197 Diarrhea, unspecified: Secondary | ICD-10-CM | POA: Diagnosis not present

## 2014-04-14 DIAGNOSIS — Z961 Presence of intraocular lens: Secondary | ICD-10-CM | POA: Diagnosis not present

## 2014-04-14 DIAGNOSIS — H40019 Open angle with borderline findings, low risk, unspecified eye: Secondary | ICD-10-CM | POA: Diagnosis not present

## 2014-05-08 DIAGNOSIS — M79609 Pain in unspecified limb: Secondary | ICD-10-CM | POA: Diagnosis not present

## 2014-06-16 DIAGNOSIS — R197 Diarrhea, unspecified: Secondary | ICD-10-CM | POA: Diagnosis not present

## 2014-07-10 DIAGNOSIS — I10 Essential (primary) hypertension: Secondary | ICD-10-CM | POA: Diagnosis not present

## 2014-07-10 DIAGNOSIS — E782 Mixed hyperlipidemia: Secondary | ICD-10-CM | POA: Diagnosis not present

## 2014-07-10 DIAGNOSIS — R197 Diarrhea, unspecified: Secondary | ICD-10-CM | POA: Diagnosis not present

## 2014-07-10 DIAGNOSIS — R7301 Impaired fasting glucose: Secondary | ICD-10-CM | POA: Diagnosis not present

## 2014-07-20 ENCOUNTER — Other Ambulatory Visit: Payer: Self-pay | Admitting: Gastroenterology

## 2014-07-20 DIAGNOSIS — R197 Diarrhea, unspecified: Secondary | ICD-10-CM | POA: Diagnosis not present

## 2014-07-31 ENCOUNTER — Ambulatory Visit
Admission: RE | Admit: 2014-07-31 | Discharge: 2014-07-31 | Disposition: A | Discharge status: Home / Self Care | Payer: Medicare Other | Source: Ambulatory Visit | Attending: Gastroenterology | Admitting: Gastroenterology

## 2014-07-31 DIAGNOSIS — R197 Diarrhea, unspecified: Secondary | ICD-10-CM

## 2014-08-07 DIAGNOSIS — M79609 Pain in unspecified limb: Secondary | ICD-10-CM | POA: Diagnosis not present

## 2014-08-07 DIAGNOSIS — B351 Tinea unguium: Secondary | ICD-10-CM | POA: Diagnosis not present

## 2014-09-15 DIAGNOSIS — Z23 Encounter for immunization: Secondary | ICD-10-CM | POA: Diagnosis not present

## 2014-09-27 DIAGNOSIS — Z23 Encounter for immunization: Secondary | ICD-10-CM | POA: Diagnosis not present

## 2014-10-16 DIAGNOSIS — L270 Generalized skin eruption due to drugs and medicaments taken internally: Secondary | ICD-10-CM | POA: Diagnosis not present

## 2015-02-09 DIAGNOSIS — E782 Mixed hyperlipidemia: Secondary | ICD-10-CM | POA: Diagnosis not present

## 2015-02-09 DIAGNOSIS — M8580 Other specified disorders of bone density and structure, unspecified site: Secondary | ICD-10-CM | POA: Diagnosis not present

## 2015-02-09 DIAGNOSIS — Z1389 Encounter for screening for other disorder: Secondary | ICD-10-CM | POA: Diagnosis not present

## 2015-02-09 DIAGNOSIS — E119 Type 2 diabetes mellitus without complications: Secondary | ICD-10-CM | POA: Diagnosis not present

## 2015-02-09 DIAGNOSIS — I10 Essential (primary) hypertension: Secondary | ICD-10-CM | POA: Diagnosis not present

## 2015-02-26 DIAGNOSIS — M79675 Pain in left toe(s): Secondary | ICD-10-CM | POA: Diagnosis not present

## 2015-02-26 DIAGNOSIS — B351 Tinea unguium: Secondary | ICD-10-CM | POA: Diagnosis not present

## 2015-02-26 DIAGNOSIS — M79674 Pain in right toe(s): Secondary | ICD-10-CM | POA: Diagnosis not present

## 2015-03-07 ENCOUNTER — Other Ambulatory Visit (HOSPITAL_COMMUNITY): Payer: Self-pay | Admitting: Family Medicine

## 2015-03-07 DIAGNOSIS — Z1231 Encounter for screening mammogram for malignant neoplasm of breast: Secondary | ICD-10-CM

## 2015-03-09 ENCOUNTER — Ambulatory Visit (HOSPITAL_COMMUNITY)
Admission: RE | Admit: 2015-03-09 | Discharge: 2015-03-09 | Disposition: A | Discharge status: Home / Self Care | Payer: BLUE CROSS/BLUE SHIELD | Source: Ambulatory Visit | Attending: Family Medicine | Admitting: Family Medicine

## 2015-03-09 DIAGNOSIS — Z1231 Encounter for screening mammogram for malignant neoplasm of breast: Secondary | ICD-10-CM | POA: Diagnosis not present

## 2015-04-17 DIAGNOSIS — H40003 Preglaucoma, unspecified, bilateral: Secondary | ICD-10-CM | POA: Diagnosis not present

## 2015-04-17 DIAGNOSIS — H43813 Vitreous degeneration, bilateral: Secondary | ICD-10-CM | POA: Diagnosis not present

## 2015-08-15 DIAGNOSIS — Z23 Encounter for immunization: Secondary | ICD-10-CM | POA: Diagnosis not present

## 2015-08-15 DIAGNOSIS — M79674 Pain in right toe(s): Secondary | ICD-10-CM | POA: Diagnosis not present

## 2015-08-15 DIAGNOSIS — B351 Tinea unguium: Secondary | ICD-10-CM | POA: Diagnosis not present

## 2015-08-15 DIAGNOSIS — L609 Nail disorder, unspecified: Secondary | ICD-10-CM | POA: Diagnosis not present

## 2015-08-15 DIAGNOSIS — M79675 Pain in left toe(s): Secondary | ICD-10-CM | POA: Diagnosis not present

## 2015-08-17 DIAGNOSIS — E119 Type 2 diabetes mellitus without complications: Secondary | ICD-10-CM | POA: Diagnosis not present

## 2015-08-17 DIAGNOSIS — I10 Essential (primary) hypertension: Secondary | ICD-10-CM | POA: Diagnosis not present

## 2015-08-17 DIAGNOSIS — E782 Mixed hyperlipidemia: Secondary | ICD-10-CM | POA: Diagnosis not present

## 2015-10-01 IMAGING — US US ABDOMEN COMPLETE
1 series · 14 of 25 positions shown · non-contrast
Comparison: None.

CLINICAL DATA: Diarrhea

EXAM:
ULTRASOUND ABDOMEN COMPLETE

[Series 1: us abdomen complete · 0.37mm/px · 14 of 83 slices shown]
[im 1/83]
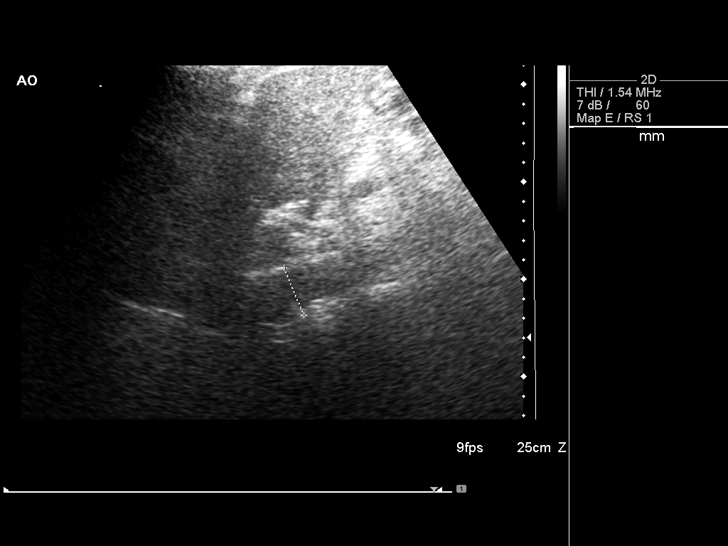
[im 7/83]
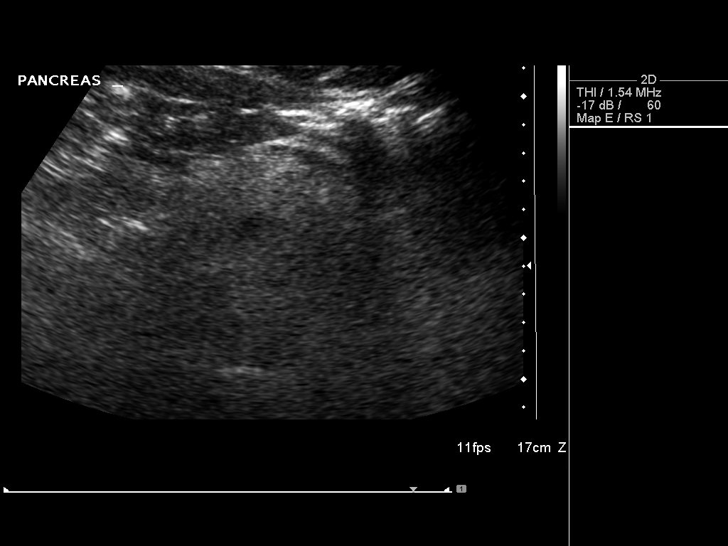
[im 14/83]
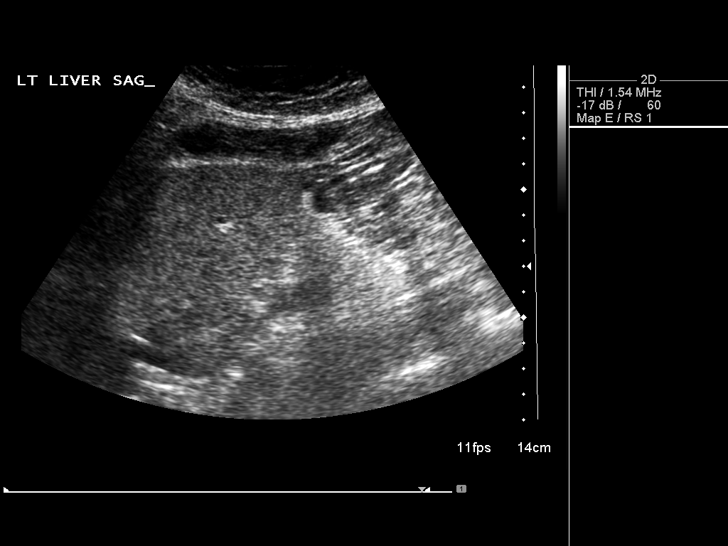
[im 21/83]
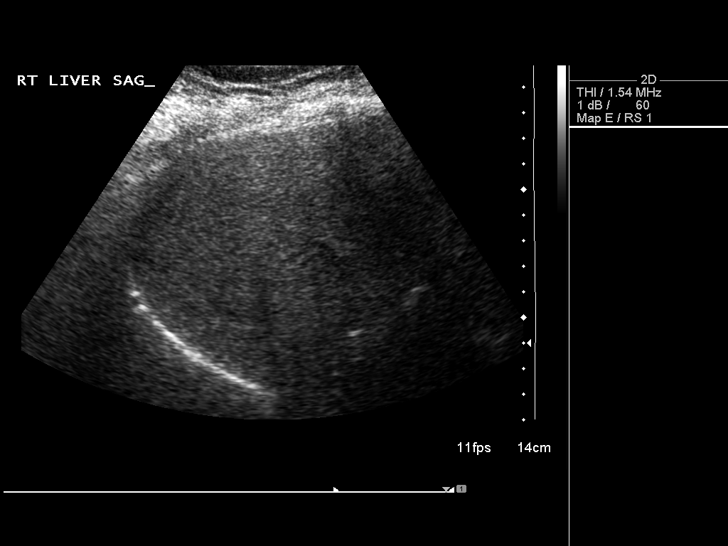
[im 28/83]
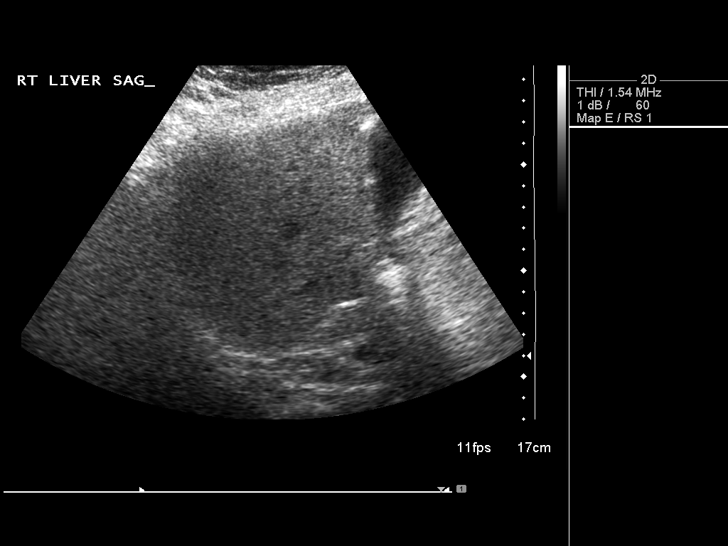
[im 31/83]
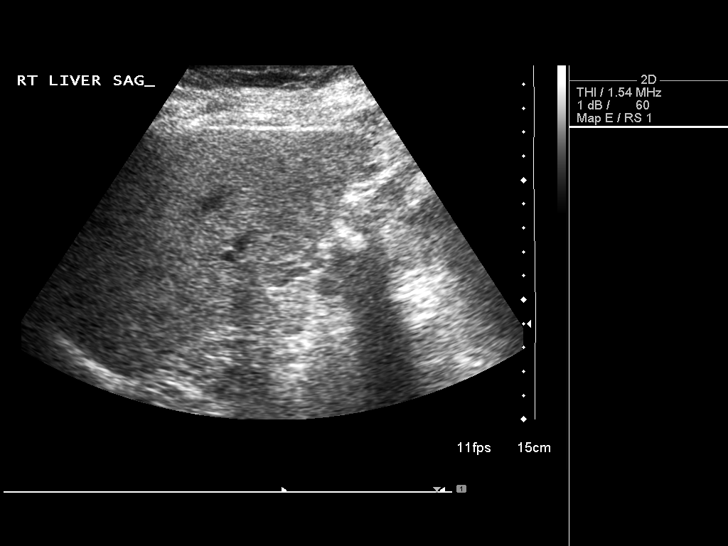
[im 38/83]
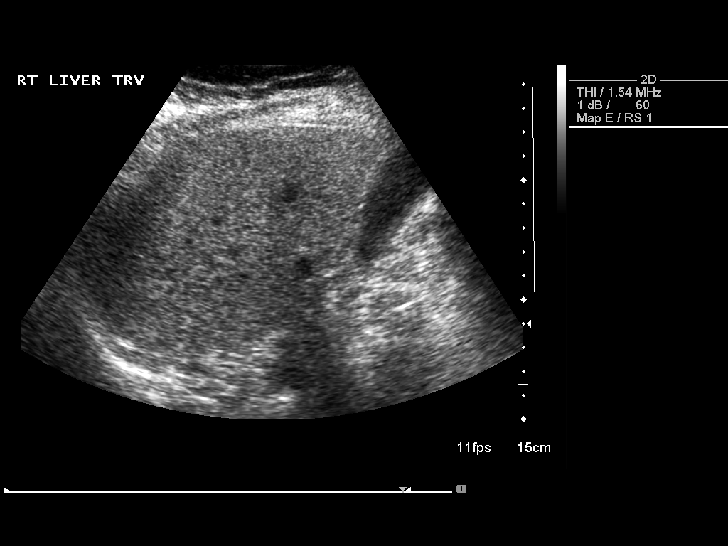
[im 45/83]
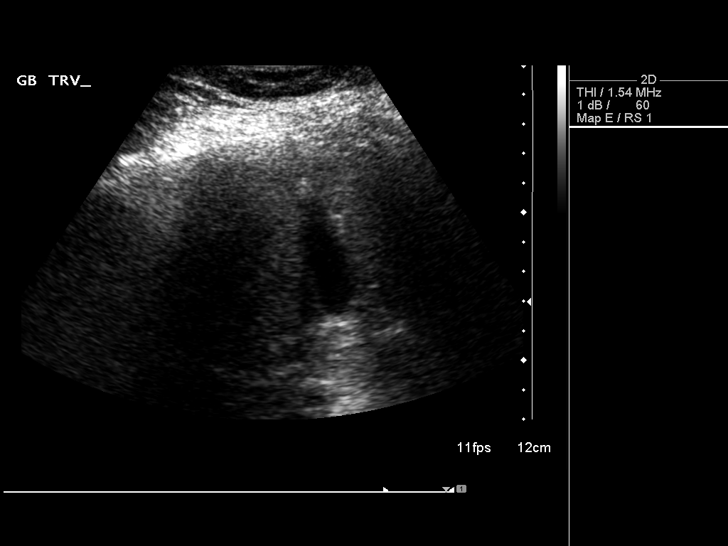
[im 52/83]
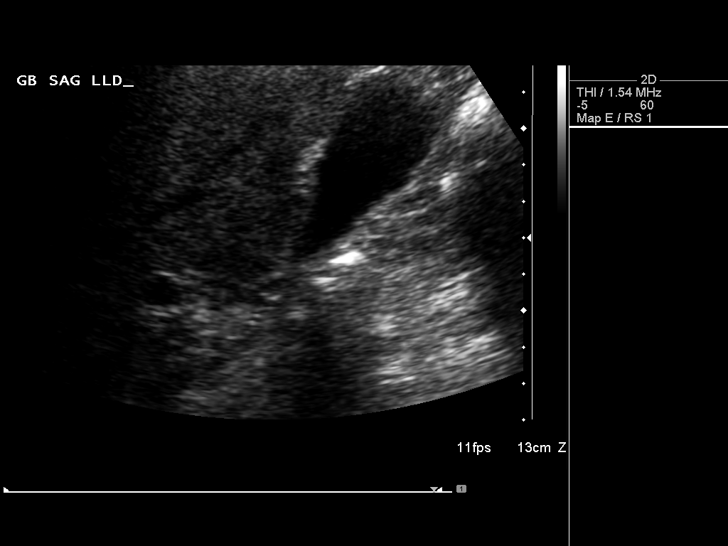
[im 55/83]
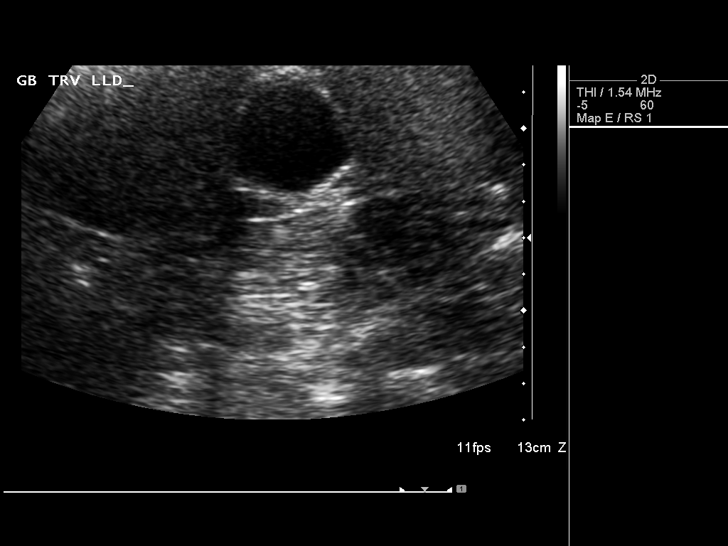
[im 62/83]
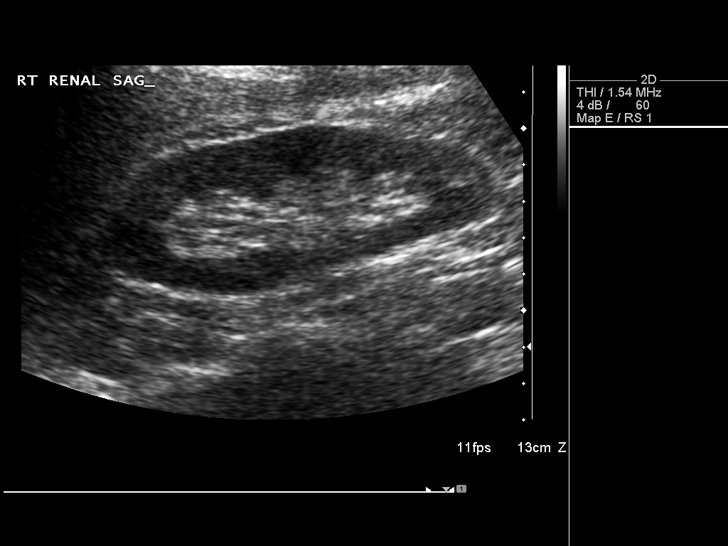
[im 69/83]
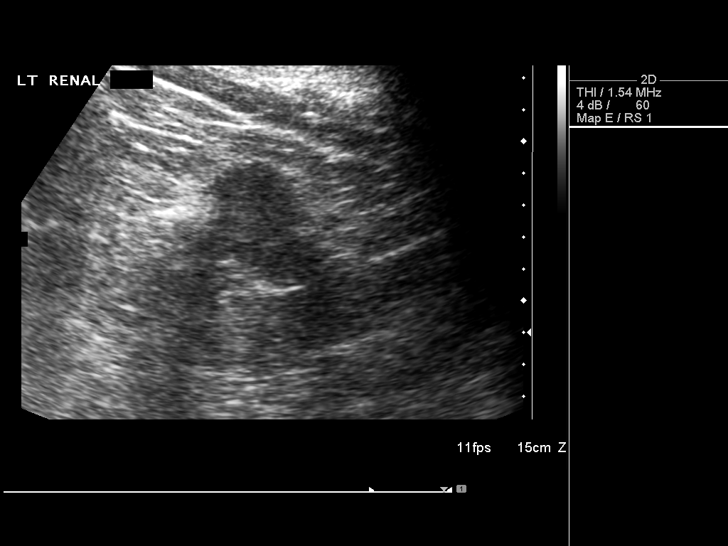
[im 76/83]
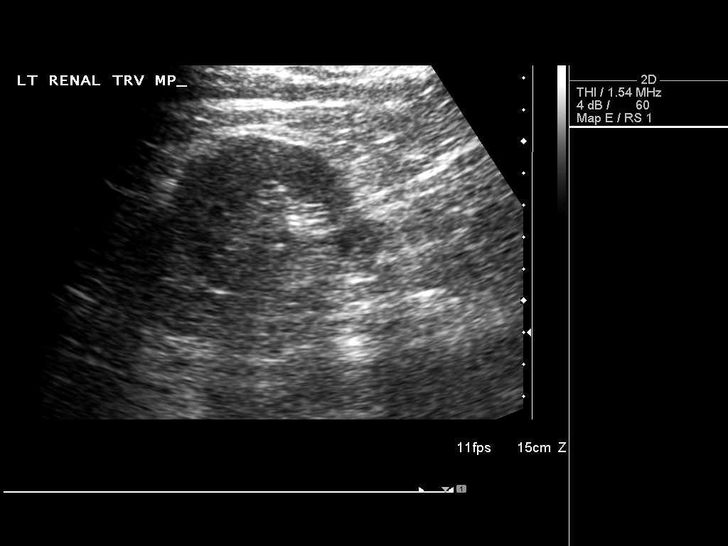
[im 83/83]
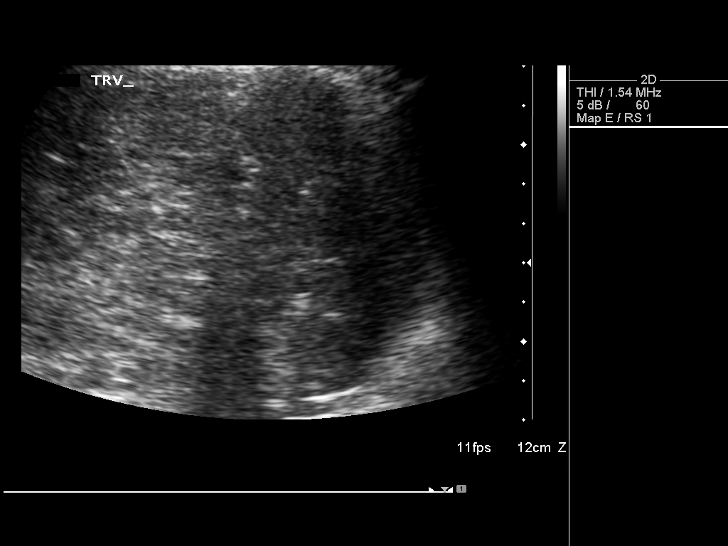

[14 of 25 positions shown; findings below may reference images not displayed]

FINDINGS: Gallbladder:

No gallstones or wall thickening visualized. No sonographic Murphy
sign noted.

Common bile duct:

Diameter: Normal at 4 mm

Liver:

Liver is uniformly increased in echogenicity. No biliary duct
dilatation. Questionable nodular contour to liver margin.

IVC:

No abnormality visualized.

Pancreas:

Visualized portion unremarkable.

Spleen:

Size and appearance within normal limits.

Right Kidney:

Length: 10.9 cm. Echogenicity within normal limits. No mass or
hydronephrosis visualized.

Left Kidney:

Length: 10.9 cm. Echogenicity within normal limits. No mass or
hydronephrosis visualized.

Abdominal aorta:

No aneurysm visualized.

Other findings:

No ascites
IMPRESSION: 1. Normal gallbladder.
2. Echogenic liver with differential including hepatic steatosis
versus hepatocellular disease.
3. Potential nodular contour of the liver.
4. No ascites or biliary duct dilatation

## 2016-02-15 DIAGNOSIS — I10 Essential (primary) hypertension: Secondary | ICD-10-CM | POA: Diagnosis not present

## 2016-02-15 DIAGNOSIS — E782 Mixed hyperlipidemia: Secondary | ICD-10-CM | POA: Diagnosis not present

## 2016-02-15 DIAGNOSIS — Z1389 Encounter for screening for other disorder: Secondary | ICD-10-CM | POA: Diagnosis not present

## 2016-02-15 DIAGNOSIS — E119 Type 2 diabetes mellitus without complications: Secondary | ICD-10-CM | POA: Diagnosis not present

## 2016-02-15 DIAGNOSIS — M8588 Other specified disorders of bone density and structure, other site: Secondary | ICD-10-CM | POA: Diagnosis not present

## 2016-04-16 DIAGNOSIS — M859 Disorder of bone density and structure, unspecified: Secondary | ICD-10-CM | POA: Diagnosis not present

## 2016-04-16 DIAGNOSIS — M8589 Other specified disorders of bone density and structure, multiple sites: Secondary | ICD-10-CM | POA: Diagnosis not present

## 2016-05-12 DIAGNOSIS — H40003 Preglaucoma, unspecified, bilateral: Secondary | ICD-10-CM | POA: Diagnosis not present

## 2016-06-07 DIAGNOSIS — L259 Unspecified contact dermatitis, unspecified cause: Secondary | ICD-10-CM | POA: Diagnosis not present

## 2016-06-24 DIAGNOSIS — H40003 Preglaucoma, unspecified, bilateral: Secondary | ICD-10-CM | POA: Diagnosis not present

## 2016-08-18 DIAGNOSIS — H40013 Open angle with borderline findings, low risk, bilateral: Secondary | ICD-10-CM | POA: Diagnosis not present

## 2016-08-26 DIAGNOSIS — E782 Mixed hyperlipidemia: Secondary | ICD-10-CM | POA: Diagnosis not present

## 2016-08-26 DIAGNOSIS — M8588 Other specified disorders of bone density and structure, other site: Secondary | ICD-10-CM | POA: Diagnosis not present

## 2016-08-26 DIAGNOSIS — I10 Essential (primary) hypertension: Secondary | ICD-10-CM | POA: Diagnosis not present

## 2016-08-26 DIAGNOSIS — E119 Type 2 diabetes mellitus without complications: Secondary | ICD-10-CM | POA: Diagnosis not present

## 2016-08-26 DIAGNOSIS — Z23 Encounter for immunization: Secondary | ICD-10-CM | POA: Diagnosis not present

## 2016-08-26 DIAGNOSIS — K429 Umbilical hernia without obstruction or gangrene: Secondary | ICD-10-CM | POA: Diagnosis not present

## 2016-09-05 DIAGNOSIS — K429 Umbilical hernia without obstruction or gangrene: Secondary | ICD-10-CM | POA: Diagnosis not present

## 2017-02-16 DIAGNOSIS — H40013 Open angle with borderline findings, low risk, bilateral: Secondary | ICD-10-CM | POA: Diagnosis not present

## 2017-02-23 DIAGNOSIS — I10 Essential (primary) hypertension: Secondary | ICD-10-CM | POA: Diagnosis not present

## 2017-02-23 DIAGNOSIS — E119 Type 2 diabetes mellitus without complications: Secondary | ICD-10-CM | POA: Diagnosis not present

## 2017-02-23 DIAGNOSIS — E782 Mixed hyperlipidemia: Secondary | ICD-10-CM | POA: Diagnosis not present

## 2017-02-23 DIAGNOSIS — Z1389 Encounter for screening for other disorder: Secondary | ICD-10-CM | POA: Diagnosis not present

## 2017-04-06 DIAGNOSIS — E119 Type 2 diabetes mellitus without complications: Secondary | ICD-10-CM | POA: Diagnosis not present

## 2017-04-06 DIAGNOSIS — K5900 Constipation, unspecified: Secondary | ICD-10-CM | POA: Diagnosis not present

## 2017-04-06 DIAGNOSIS — K429 Umbilical hernia without obstruction or gangrene: Secondary | ICD-10-CM | POA: Diagnosis not present

## 2017-08-26 DIAGNOSIS — Z23 Encounter for immunization: Secondary | ICD-10-CM | POA: Diagnosis not present

## 2017-08-26 DIAGNOSIS — I10 Essential (primary) hypertension: Secondary | ICD-10-CM | POA: Diagnosis not present

## 2017-08-26 DIAGNOSIS — E782 Mixed hyperlipidemia: Secondary | ICD-10-CM | POA: Diagnosis not present

## 2017-08-26 DIAGNOSIS — E119 Type 2 diabetes mellitus without complications: Secondary | ICD-10-CM | POA: Diagnosis not present

## 2017-10-09 DIAGNOSIS — L600 Ingrowing nail: Secondary | ICD-10-CM | POA: Diagnosis not present

## 2017-10-26 DIAGNOSIS — L600 Ingrowing nail: Secondary | ICD-10-CM | POA: Diagnosis not present

## 2017-11-09 DIAGNOSIS — L600 Ingrowing nail: Secondary | ICD-10-CM | POA: Diagnosis not present

## 2017-11-26 DIAGNOSIS — M79675 Pain in left toe(s): Secondary | ICD-10-CM | POA: Diagnosis not present

## 2017-11-26 DIAGNOSIS — M7989 Other specified soft tissue disorders: Secondary | ICD-10-CM | POA: Diagnosis not present

## 2017-11-26 DIAGNOSIS — W2209XA Striking against other stationary object, initial encounter: Secondary | ICD-10-CM | POA: Diagnosis not present

## 2017-11-26 DIAGNOSIS — I10 Essential (primary) hypertension: Secondary | ICD-10-CM | POA: Diagnosis not present

## 2017-11-26 DIAGNOSIS — S99922A Unspecified injury of left foot, initial encounter: Secondary | ICD-10-CM | POA: Diagnosis not present

## 2017-11-26 DIAGNOSIS — S90122A Contusion of left lesser toe(s) without damage to nail, initial encounter: Secondary | ICD-10-CM | POA: Diagnosis not present

## 2017-11-26 DIAGNOSIS — E119 Type 2 diabetes mellitus without complications: Secondary | ICD-10-CM | POA: Diagnosis not present

## 2017-11-27 DIAGNOSIS — R2242 Localized swelling, mass and lump, left lower limb: Secondary | ICD-10-CM | POA: Diagnosis not present

## 2017-11-27 DIAGNOSIS — M79605 Pain in left leg: Secondary | ICD-10-CM | POA: Diagnosis not present

## 2017-11-27 DIAGNOSIS — M7989 Other specified soft tissue disorders: Secondary | ICD-10-CM | POA: Diagnosis not present

## 2018-03-29 DIAGNOSIS — H35372 Puckering of macula, left eye: Secondary | ICD-10-CM | POA: Diagnosis not present

## 2018-03-29 DIAGNOSIS — H26492 Other secondary cataract, left eye: Secondary | ICD-10-CM | POA: Diagnosis not present

## 2018-04-29 DIAGNOSIS — H26492 Other secondary cataract, left eye: Secondary | ICD-10-CM | POA: Diagnosis not present

## 2018-07-28 DIAGNOSIS — Z0000 Encounter for general adult medical examination without abnormal findings: Secondary | ICD-10-CM | POA: Diagnosis not present

## 2018-07-28 DIAGNOSIS — G250 Essential tremor: Secondary | ICD-10-CM | POA: Diagnosis not present

## 2018-07-28 DIAGNOSIS — Z1389 Encounter for screening for other disorder: Secondary | ICD-10-CM | POA: Diagnosis not present

## 2018-07-28 DIAGNOSIS — M8588 Other specified disorders of bone density and structure, other site: Secondary | ICD-10-CM | POA: Diagnosis not present

## 2018-07-28 DIAGNOSIS — I10 Essential (primary) hypertension: Secondary | ICD-10-CM | POA: Diagnosis not present

## 2018-07-28 DIAGNOSIS — E1169 Type 2 diabetes mellitus with other specified complication: Secondary | ICD-10-CM | POA: Diagnosis not present

## 2018-07-28 DIAGNOSIS — E782 Mixed hyperlipidemia: Secondary | ICD-10-CM | POA: Diagnosis not present

## 2018-09-09 DIAGNOSIS — Z23 Encounter for immunization: Secondary | ICD-10-CM | POA: Diagnosis not present

## 2018-09-16 DIAGNOSIS — M8588 Other specified disorders of bone density and structure, other site: Secondary | ICD-10-CM | POA: Diagnosis not present

## 2019-01-25 DIAGNOSIS — Z961 Presence of intraocular lens: Secondary | ICD-10-CM | POA: Diagnosis not present

## 2019-01-25 DIAGNOSIS — H353131 Nonexudative age-related macular degeneration, bilateral, early dry stage: Secondary | ICD-10-CM | POA: Diagnosis not present

## 2019-02-09 DIAGNOSIS — E782 Mixed hyperlipidemia: Secondary | ICD-10-CM | POA: Diagnosis not present

## 2019-02-09 DIAGNOSIS — I10 Essential (primary) hypertension: Secondary | ICD-10-CM | POA: Diagnosis not present

## 2019-02-09 DIAGNOSIS — Z1389 Encounter for screening for other disorder: Secondary | ICD-10-CM | POA: Diagnosis not present

## 2019-02-09 DIAGNOSIS — E1169 Type 2 diabetes mellitus with other specified complication: Secondary | ICD-10-CM | POA: Diagnosis not present

## 2019-08-17 DIAGNOSIS — E1169 Type 2 diabetes mellitus with other specified complication: Secondary | ICD-10-CM | POA: Diagnosis not present

## 2019-08-17 DIAGNOSIS — I10 Essential (primary) hypertension: Secondary | ICD-10-CM | POA: Diagnosis not present

## 2019-08-17 DIAGNOSIS — E782 Mixed hyperlipidemia: Secondary | ICD-10-CM | POA: Diagnosis not present

## 2019-08-17 DIAGNOSIS — Z7984 Long term (current) use of oral hypoglycemic drugs: Secondary | ICD-10-CM | POA: Diagnosis not present

## 2020-01-05 ENCOUNTER — Ambulatory Visit: Payer: Self-pay

## 2020-01-13 ENCOUNTER — Ambulatory Visit: Payer: Self-pay | Attending: Internal Medicine

## 2020-02-14 DIAGNOSIS — E119 Type 2 diabetes mellitus without complications: Secondary | ICD-10-CM | POA: Diagnosis not present

## 2020-02-14 DIAGNOSIS — H353122 Nonexudative age-related macular degeneration, left eye, intermediate dry stage: Secondary | ICD-10-CM | POA: Diagnosis not present

## 2021-03-16 DIAGNOSIS — H5203 Hypermetropia, bilateral: Secondary | ICD-10-CM | POA: Diagnosis not present

## 2021-04-05 DIAGNOSIS — E1165 Type 2 diabetes mellitus with hyperglycemia: Secondary | ICD-10-CM | POA: Diagnosis not present

## 2021-04-05 DIAGNOSIS — I10 Essential (primary) hypertension: Secondary | ICD-10-CM | POA: Diagnosis not present

## 2021-04-05 DIAGNOSIS — Z7984 Long term (current) use of oral hypoglycemic drugs: Secondary | ICD-10-CM | POA: Diagnosis not present

## 2021-04-09 ENCOUNTER — Encounter (INDEPENDENT_AMBULATORY_CARE_PROVIDER_SITE_OTHER): Payer: Self-pay

## 2021-04-09 DIAGNOSIS — H353131 Nonexudative age-related macular degeneration, bilateral, early dry stage: Secondary | ICD-10-CM | POA: Diagnosis not present

## 2021-04-09 DIAGNOSIS — E119 Type 2 diabetes mellitus without complications: Secondary | ICD-10-CM | POA: Diagnosis not present

## 2021-04-09 DIAGNOSIS — Z961 Presence of intraocular lens: Secondary | ICD-10-CM | POA: Diagnosis not present

## 2021-04-09 DIAGNOSIS — H35372 Puckering of macula, left eye: Secondary | ICD-10-CM | POA: Diagnosis not present

## 2021-04-29 ENCOUNTER — Ambulatory Visit (INDEPENDENT_AMBULATORY_CARE_PROVIDER_SITE_OTHER): Payer: Medicare HMO | Admitting: Ophthalmology

## 2021-04-29 ENCOUNTER — Encounter (INDEPENDENT_AMBULATORY_CARE_PROVIDER_SITE_OTHER): Payer: Self-pay | Admitting: Ophthalmology

## 2021-04-29 ENCOUNTER — Other Ambulatory Visit: Payer: Self-pay

## 2021-04-29 DIAGNOSIS — H35372 Puckering of macula, left eye: Secondary | ICD-10-CM | POA: Diagnosis not present

## 2021-04-29 DIAGNOSIS — R0683 Snoring: Secondary | ICD-10-CM | POA: Diagnosis not present

## 2021-04-29 DIAGNOSIS — H353130 Nonexudative age-related macular degeneration, bilateral, stage unspecified: Secondary | ICD-10-CM

## 2021-04-29 DIAGNOSIS — H353132 Nonexudative age-related macular degeneration, bilateral, intermediate dry stage: Secondary | ICD-10-CM | POA: Diagnosis not present

## 2021-04-29 NOTE — Patient Instructions (Addendum)
Age-Related Macular Degeneration  Age-related macular degeneration (AMD) is an eye disease related to aging. The disease causes a loss of central vision. Central vision allows a person to see objects clearly and do daily tasks like reading and driving. There are two main types of AMD:  Dry AMD. People with this type generally lose their vision slowly. This is the most common type of AMD. Some people with dry AMD notice very little change in their vision as they age.  Wet AMD. People with this type can lose their vision quickly. What are the causes? This condition is caused by damage to the part of the eye that provides you with central vision (macula).  Dry AMD happens when deposits in the macula cause light-sensitive cells to slowly break down.  Wet AMD happens when abnormal blood vessels grow under the macula and leak blood and fluid. What increases the risk? You are more likely to develop this condition if you:  Are 50 years old or older, and especially 75 years old or older.  Smoke.  Are obese.  Have a family history of AMD.  Have high cholesterol, high blood pressure, or heart disease.  Have been exposed to high levels of ultraviolet (UV) light and blue light.  Are white (Caucasian).  Are female. What are the signs or symptoms? Common symptoms of this condition include:  Blurred vision, especially when reading print material. The blurred vision often improves in brighter light.  A blurred or blind spot in the center of your field of vision that is small but growing larger.  Bright colors seeming less bright than they used to be.  Decreased ability to recognize and see faces.  One eye seeing worse than the other.  Decreased ability to adapt to dimly lit rooms.  Straight lines appearing crooked or wavy. How is this diagnosed? This condition is diagnosed based on your symptoms and an eye exam. During the eye exam:  Eye drops will be placed into your eyes to  enlarge (dilate) your pupils. This will allow your health care provider to see the back of your eye.  You may be asked to look at an image that looks like a checkerboard (Amsler grid). Early changes in your central vision may cause the grid to appear distorted. After the exam, you may be given one or both of these tests:  Fluorescein angiogram. This test determines whether you have dry or wet AMD.  Optical coherence tomography (OCT) test to evaluate deep layers of the retina. How is this treated? There is no cure for this condition, but treatment can help to slow down progression of the disease. This condition may be treated with:  Supplements, including vitamin C, vitamin E, beta carotene, and zinc.  Laser surgery to destroy new blood vessels or leaking blood vessels in your eye.  Injections of medicines into your eye to slow down the formation of abnormal blood vessels that may leak. These injections may need to be repeated on a routine basis. Follow these instructions at home:  Take over-the-counter and prescription medicines only as told by your health care provider.  Take vitamins and supplements as told by your health care provider.  Ask your health care provider for an Amsler grid. Use it every day to check each eye for vision changes.  Get an eye exam as often as told by your health care provider. Make sure to get an eye exam at least once every year.  Keep all follow-up visits as told by   your health care provider. This is important. Contact a health care provider if:  You notice any new changes in your vision. Get help right away if:  You suddenly lose vision or develop pain in the eye. Summary  Age-related macular degeneration (AMD) is an eye disease related to aging. There are two types of this condition: dry AMD and wet AMD.  This condition is caused by damage to the part of the eye that provides you with central vision (macula).  Once diagnosed with AMD, make sure  to get an eye exam every year, take supplements and vitamins as directed, use an Amsler grid at home, and follow up with your health care provider. This information is not intended to replace advice given to you by your health care provider. Make sure you discuss any questions you have with your health care provider. Document Revised: 06/02/2018 Document Reviewed: 06/02/2018 Elsevier Patient Education  [place].

## 2021-04-29 NOTE — Assessment & Plan Note (Signed)

## 2021-04-29 NOTE — Progress Notes (Signed)
04/29/2021     CHIEF COMPLAINT Patient presents for Retina Evaluation (NP- ERM OS with changes/Pt states, "I was sent here by my regular eye doctor because he does not like the changes he has been seeing to my retina. "/Type II DM: PO MEDS /A1C: 7.4/LBS: Does not check)   HISTORY OF PRESENT ILLNESS: Sheri Moreno is a 80 y.o. female who presents to the clinic today for:   HPI    Retina Evaluation    Laterality: left eye   Onset: 1 year ago   Duration: 1 year   Associated Symptoms: Floaters (stable old flaoters), Distortion (some wavy lines at time OS) and Pain (Occcasional pain behind OS).  Negative for Flashes   Comments: NP- ERM OS with changes Pt states, "I was sent here by my regular eye doctor because he does not like the changes he has been seeing to my retina. " Type II DM: PO MEDS  A1C: 7.4 LBS: Does not check       Last edited by Kendra Opitz, COA on 04/29/2021  3:09 PM. (History)      Referring physician: Donald Prose, MD [place],  [place] [id number]  HISTORICAL INFORMATION:   Selected notes from the [facility]: No current outpatient medications on file. (Ophthalmic Drugs)   No current facility-administered medications for this visit. (Ophthalmic Drugs)   Current Outpatient Medications (Other)  Medication Sig  . calcium carbonate (OS-CAL - DOSED IN MG OF ELEMENTAL CALCIUM) 1250 MG tablet Take 1 tablet by mouth daily.    . Misc Natural Products (OSTEO BI-FLEX ADV JOINT SHIELD PO) Take 1 tablet by mouth daily.    . nebivolol (BYSTOLIC) 5 MG tablet Take 1 tablet (5 mg total) by mouth daily.   No current facility-administered medications for this visit. (Other)      REVIEW OF SYSTEMS:    ALLERGIES Allergies  Allergen Reactions  . Ace Inhibitors Cough  . Penicillins Hives and Rash    PAST MEDICAL HISTORY Past Medical History:  Diagnosis Date  . Cataract   . Hypertension   .  Obesity    Past Surgical History:  Procedure Laterality Date  . CATARACT EXTRACTION    . TONSILLECTOMY      FAMILY HISTORY Family History  Problem Relation Age of Onset  . Pancreatic cancer Sister   . Stomach cancer Father   . Hypertension Other   . Lung cancer Maternal Aunt     SOCIAL HISTORY Social History   Tobacco Use  . Smoking status: Never Smoker  Substance Use Topics  . Alcohol use: Yes    Comment: rarely         OPHTHALMIC EXAM:  Base Eye Exam    Visual Acuity (ETDRS)      Right Left   Dist cc 20/40 -1 20/70 -2   Dist ph cc NI NI   Correction: Glasses       Tonometry (Tonopen, 3:13 PM)      Right Left   Pressure 18 18       Pupils      Pupils Dark Light Shape React APD   Right PERRL 5 4 Round Brisk None   Left PERRL 5 4 Round Brisk None       Visual Fields (Counting fingers)      Left Right    Full Full       Extraocular Movement  Right Left    Full Full       Neuro/Psych    Oriented x3: Yes       Dilation    Both eyes: 1.0% Mydriacyl, 2.5% Phenylephrine @ 3:12 PM        Slit Lamp and Fundus Exam    External Exam      Right Left   External Normal Normal       Slit Lamp Exam      Right Left   Lids/Lashes Normal Normal   Conjunctiva/Sclera White and quiet White and quiet   Cornea Clear Clear   Anterior Chamber Deep and quiet Deep and quiet   Iris Round and reactive Round and reactive   Lens Centered posterior chamber intraocular lens Centered posterior chamber intraocular lens   Anterior Vitreous Normal Normal       Fundus Exam      Right Left   Posterior Vitreous Posterior vitreous detachment Posterior vitreous detachment   Disc Normal Normal   C/D Ratio 0.35 0.35   Macula Large soft drusen  Large soft drusen    Vessels no DR no DR   Periphery Normal Normal          IMAGING AND PROCEDURES  Imaging and Procedures for 04/29/21  OCT, Retina - OU - Both Eyes       Right Eye Quality was good. Scan  locations included subfoveal. Central Foveal Thickness: 318. Progression has been stable. Findings include retinal drusen , epiretinal membrane.   Left Eye Quality was good. Scan locations included subfoveal. Central Foveal Thickness: 346. Progression has been stable. Findings include epiretinal membrane, retinal drusen .                 ASSESSMENT/PLAN:  Snores Patient has a review of systems at slightly positive for sleep apnea including awakening most nights but does not nap as she still works.  Nonetheless she has been told she snores and that does raise the possibility that she has sleep apnea of a mild to moderate extent.  If so physiologic nighttime hypoxia to the macula can make macular conditions worse physiologically.  For this reason I have suggested that she seek counsel with Dr. Nancy Fetter, her PCP to consider at home sleep testing for sleep apnea.  Intermediate stage nonexudative age-related macular degeneration of both eyes The nature of age--related macular degeneration was discussed with the patient as well as the distinction between dry and wet types. Checking an Amsler Grid daily with advice to return immediately should a distortion develop, was given to the patient. The patient 's smoking status now and in the past was determined and advice based on the AREDS study was provided regarding the consumption of antioxidant supplements. AREDS 2 vitamin formulation was recommended. Consumption of dark leafy vegetables and fresh fruits of various colors was recommended. Treatment modalities for wet macular degeneration particularly the use of intravitreal injections of anti-blood vessel growth factors was discussed with the patient. Avastin, Lucentis, and Eylea are the available options. On occasion, therapy includes the use of photodynamic therapy and thermal laser. Stressed to the patient do not rub eyes.  Patient was advised to check Amsler Grid daily and return immediately if changes are  noted. Instructions on using the grid were given to the patient. All patient questions were answered.  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 Snores  R06.83   3. Intermediate stage nonexudative age-related macular degeneration of both eyes  H35.3132   4. Cellophane retinopathy, left eye  H35.372 OCT, Retina - OU - Both Eyes    1.  Patient does continue on oral vitamin supplementation (Ocuvite or similar).  2.  Patient should monitor vision at least weekly if not daily with grid testing to look for new onset distortion.  3.  Ophthalmic Meds Ordered this visit:  No orders of the defined types were placed in this encounter.      Return in about 4 months (around 08/30/2021).  Patient Instructions  Macular degeneration Age-Related Macular Degeneration  Age-related macular degeneration (AMD) is an eye disease related to aging. The disease causes a loss of central vision. Central vision allows a person to see objects clearly and do daily tasks like reading and driving. There are two main types of AMD:  Dry AMD. People with this type generally lose their vision slowly. This is the most common type of AMD. Some people with dry AMD notice very little change in their vision as they age.  Wet AMD. People with this type can lose their vision quickly. What are the causes? This condition is caused by damage to the part of the eye that provides you with central vision (macula).  Dry AMD happens when deposits in the macula cause light-sensitive cells to slowly break down.  Wet AMD happens when abnormal blood vessels grow under the macula and leak blood and fluid. What increases the risk? You are more likely to develop this condition if you:  Are 45 years old or older, and  especially 39 years old or older.  Smoke.  Are obese.  Have a family history of AMD.  Have high cholesterol, high blood pressure, or heart disease.  Have been exposed to high levels of ultraviolet (UV) light and blue light.  Are white (Caucasian).  Are female. What are the signs or symptoms? Common symptoms of this condition include:  Blurred vision, especially when reading print material. The blurred vision often improves in brighter light.  A blurred or blind spot in the center of your field of vision that is small but growing larger.  Bright colors seeming less bright than they used to be.  Decreased ability to recognize and see faces.  One eye seeing worse than the other.  Decreased ability to adapt to dimly lit rooms.  Straight lines appearing crooked or wavy. How is this diagnosed? This condition is diagnosed based on your symptoms and an eye exam. During the eye exam:  Eye drops will be placed into your eyes to enlarge (dilate) your pupils. This will allow your health care provider to see the back of your eye.  You may be asked to look at an image that looks like a checkerboard (Amsler grid). Early changes in your central vision may cause the grid to appear distorted. After the exam, you may be given one or both of these tests:  Fluorescein angiogram. This test determines whether you have dry or wet AMD.  Optical coherence tomography (OCT) test to evaluate deep layers of the retina. How is this treated? There is no cure for this condition, but treatment can help to slow down progression of the disease. This condition may be treated with:  Supplements, including vitamin C, vitamin E, beta carotene, and zinc.  Laser surgery to destroy new blood vessels or  leaking blood vessels in your eye.  Injections of medicines into your eye to slow down the formation of abnormal blood vessels that may leak. These injections may need to be repeated on a routine basis. Follow  these instructions at home:  Take over-the-counter and prescription medicines only as told by your health care provider.  Take vitamins and supplements as told by your health care provider.  Ask your health care provider for an Amsler grid. Use it every day to check each eye for vision changes.  Get an eye exam as often as told by your health care provider. Make sure to get an eye exam at least once every year.  Keep all follow-up visits as told by your health care provider. This is important. Contact a health care provider if:  You notice any new changes in your vision. Get help right away if:  You suddenly lose vision or develop pain in the eye. Summary  Age-related macular degeneration (AMD) is an eye disease related to aging. There are two types of this condition: dry AMD and wet AMD.  This condition is caused by damage to the part of the eye that provides you with central vision (macula).  Once diagnosed with AMD, make sure to get an eye exam every year, take supplements and vitamins as directed, use an Amsler grid at home, and follow up with your health care provider. This information is not intended to replace advice given to you by your health care provider. Make sure you discuss any questions you have with your health care provider. Document Revised: 06/02/2018 Document Reviewed: 06/02/2018 Elsevier Patient Education  2021 [place] the diagnoses, plan, and follow up with the patient and they expressed understanding.  Patient expressed understanding of the importance of proper follow up care.   Clent Demark Szymon Foiles M.D. Diseases & Surgery of the Retina and Vitreous Retina & Diabetic [place] 04/29/21     Abbreviations: M myopia (nearsighted); A astigmatism; H hyperopia (farsighted); P presbyopia; Mrx spectacle prescription;  CTL contact lenses; OD right eye; OS left eye; OU both eyes  XT exotropia; ET esotropia; PEK punctate epithelial keratitis; PEE  punctate epithelial erosions; DES dry eye syndrome; MGD meibomian gland dysfunction; ATs artificial tears; PFAT's preservative free artificial tears; [place] nuclear sclerotic cataract; PSC posterior subcapsular cataract; ERM epi-retinal membrane; PVD posterior vitreous detachment; RD retinal detachment; DM diabetes mellitus; DR diabetic retinopathy; NPDR non-proliferative diabetic retinopathy; PDR proliferative diabetic retinopathy; CSME clinically significant macular edema; DME diabetic macular edema; dbh dot blot hemorrhages; CWS cotton wool spot; POAG primary open angle glaucoma; C/D cup-to-disc ratio; HVF humphrey visual field; GVF goldmann visual field; OCT optical coherence tomography; IOP intraocular pressure; BRVO Branch retinal vein occlusion; CRVO central retinal vein occlusion; CRAO central retinal artery occlusion; BRAO branch retinal artery occlusion; RT retinal tear; SB scleral buckle; PPV pars plana vitrectomy; VH Vitreous hemorrhage; PRP panretinal laser photocoagulation; IVK intravitreal kenalog; VMT vitreomacular traction; MH Macular hole;  NVD neovascularization of the disc; NVE neovascularization elsewhere; AREDS age related eye disease study; ARMD age related macular degeneration; POAG primary open angle glaucoma; EBMD epithelial/anterior basement membrane dystrophy; ACIOL anterior chamber intraocular lens; IOL intraocular lens; PCIOL posterior chamber intraocular lens; Phaco/IOL phacoemulsification with intraocular lens placement; [place] photorefractive keratectomy; LASIK laser assisted in situ keratomileusis; HTN hypertension; DM diabetes mellitus; COPD chronic obstructive pulmonary disease

## 2021-04-29 NOTE — Assessment & Plan Note (Signed)
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

## 2021-04-29 NOTE — Assessment & Plan Note (Signed)
Patient has a review of systems at slightly positive for sleep apnea including awakening most nights but does not nap as she still works.  Nonetheless she has been told she snores and that does raise the possibility that she has sleep apnea of a mild to moderate extent.  If so physiologic nighttime hypoxia to the macula can make macular conditions worse physiologically.  For this reason I have suggested that she seek counsel with Dr. Nancy Fetter, her PCP to consider at home sleep testing for sleep apnea.

## 2021-07-03 DIAGNOSIS — I10 Essential (primary) hypertension: Secondary | ICD-10-CM | POA: Diagnosis not present

## 2021-07-03 DIAGNOSIS — E1169 Type 2 diabetes mellitus with other specified complication: Secondary | ICD-10-CM | POA: Diagnosis not present

## 2021-07-03 DIAGNOSIS — R0683 Snoring: Secondary | ICD-10-CM | POA: Diagnosis not present

## 2021-07-03 DIAGNOSIS — M8588 Other specified disorders of bone density and structure, other site: Secondary | ICD-10-CM | POA: Diagnosis not present

## 2021-07-03 DIAGNOSIS — H353132 Nonexudative age-related macular degeneration, bilateral, intermediate dry stage: Secondary | ICD-10-CM | POA: Diagnosis not present

## 2021-07-03 DIAGNOSIS — Z1389 Encounter for screening for other disorder: Secondary | ICD-10-CM | POA: Diagnosis not present

## 2021-07-03 DIAGNOSIS — Z7984 Long term (current) use of oral hypoglycemic drugs: Secondary | ICD-10-CM | POA: Diagnosis not present

## 2021-07-03 DIAGNOSIS — E782 Mixed hyperlipidemia: Secondary | ICD-10-CM | POA: Diagnosis not present

## 2021-07-08 ENCOUNTER — Other Ambulatory Visit: Payer: Self-pay | Admitting: Family Medicine

## 2021-07-08 DIAGNOSIS — M8580 Other specified disorders of bone density and structure, unspecified site: Secondary | ICD-10-CM

## 2021-07-29 DIAGNOSIS — H3530 Unspecified macular degeneration: Secondary | ICD-10-CM | POA: Diagnosis not present

## 2021-07-29 DIAGNOSIS — G4733 Obstructive sleep apnea (adult) (pediatric): Secondary | ICD-10-CM | POA: Diagnosis not present

## 2021-07-29 DIAGNOSIS — I10 Essential (primary) hypertension: Secondary | ICD-10-CM | POA: Diagnosis not present

## 2021-07-30 DIAGNOSIS — G4733 Obstructive sleep apnea (adult) (pediatric): Secondary | ICD-10-CM | POA: Diagnosis not present

## 2021-07-31 DIAGNOSIS — I10 Essential (primary) hypertension: Secondary | ICD-10-CM | POA: Diagnosis not present

## 2021-08-16 DIAGNOSIS — G4733 Obstructive sleep apnea (adult) (pediatric): Secondary | ICD-10-CM | POA: Diagnosis not present

## 2021-09-02 ENCOUNTER — Encounter (INDEPENDENT_AMBULATORY_CARE_PROVIDER_SITE_OTHER): Payer: Medicare HMO | Admitting: Ophthalmology

## 2021-09-15 DIAGNOSIS — G4733 Obstructive sleep apnea (adult) (pediatric): Secondary | ICD-10-CM | POA: Diagnosis not present

## 2021-10-14 DIAGNOSIS — I10 Essential (primary) hypertension: Secondary | ICD-10-CM | POA: Diagnosis not present

## 2021-10-14 DIAGNOSIS — G4733 Obstructive sleep apnea (adult) (pediatric): Secondary | ICD-10-CM | POA: Diagnosis not present

## 2021-10-16 DIAGNOSIS — G4733 Obstructive sleep apnea (adult) (pediatric): Secondary | ICD-10-CM | POA: Diagnosis not present

## 2021-10-21 ENCOUNTER — Other Ambulatory Visit: Payer: Self-pay

## 2021-10-21 ENCOUNTER — Ambulatory Visit (INDEPENDENT_AMBULATORY_CARE_PROVIDER_SITE_OTHER): Payer: Medicare HMO | Admitting: Ophthalmology

## 2021-10-21 ENCOUNTER — Encounter (INDEPENDENT_AMBULATORY_CARE_PROVIDER_SITE_OTHER): Payer: Self-pay | Admitting: Ophthalmology

## 2021-10-21 DIAGNOSIS — H353130 Nonexudative age-related macular degeneration, bilateral, stage unspecified: Secondary | ICD-10-CM | POA: Diagnosis not present

## 2021-10-21 DIAGNOSIS — H353132 Nonexudative age-related macular degeneration, bilateral, intermediate dry stage: Secondary | ICD-10-CM | POA: Diagnosis not present

## 2021-10-21 DIAGNOSIS — R0683 Snoring: Secondary | ICD-10-CM

## 2021-10-21 DIAGNOSIS — G4733 Obstructive sleep apnea (adult) (pediatric): Secondary | ICD-10-CM | POA: Insufficient documentation

## 2021-10-21 NOTE — Assessment & Plan Note (Signed)
Upon last visit in the office here May 2022, patient follow-up with her PCP for positive review of systems for sleep apnea.  In fact she was found to have severe sleep apnea.  Therapy was ordered, machinery was supply chain restricted for availability until beginning of September 2022  She began CPAP therapy 2022 and and now has fewer awakenings for nocturia or otherwise.  Does not notice a real change in energy however.

## 2021-10-21 NOTE — Assessment & Plan Note (Signed)
Upon last visit in the office here May 2022, patient follow-up with her PCP for positive review of systems for sleep apnea.  In fact she was found to have severe sleep apnea.  Therapy was ordered, machinery was supply chain restricted for availability until beginning of September 2022  She began CPAP therapy 2022 and and now has fewer awakenings for nocturia or otherwise.  Does not notice a real change in energy however.  Please note coincident large subfoveal drusenoid pigment epithelial detachment months subfoveal in each eye have now resolved, concomitant with institution of nightly CPAP for what was diagnosed with severe OSA

## 2021-10-21 NOTE — Progress Notes (Signed)
10/21/2021     CHIEF COMPLAINT Patient presents for  Chief Complaint  Patient presents with   Retina Follow Up      HISTORY OF PRESENT ILLNESS: Sheri Moreno is a 80 y.o. female who presents to the clinic today for:   HPI     Retina Follow Up   Patient presents with  Dry AMD.  In both eyes.  This started 5 months ago.  Severity is mild.  Duration of 5 months.  Since onset it is gradually worsening.        Comments   5 mos fu OU oct (r/s from September). Pt states vision has worsened, noticed change in Amsler Grid lines. "The lines are more wavy on the grid." Pt states she is now on the CPAP, as of September 2022 .      Last edited by Hurman Horn, MD on 10/21/2021  8:46 AM.      Referring physician: Donald Prose, MD [place],  [place] [id number]  HISTORICAL INFORMATION:   Selected notes from the [facility]: No current outpatient medications on file. (Ophthalmic Drugs)   No current facility-administered medications for this visit. (Ophthalmic Drugs)   Current Outpatient Medications (Other)  Medication Sig   calcium carbonate (OS-CAL - DOSED IN MG OF ELEMENTAL CALCIUM) 1250 MG tablet Take 1 tablet by mouth daily.     Misc Natural Products (OSTEO BI-FLEX ADV JOINT SHIELD PO) Take 1 tablet by mouth daily.     nebivolol (BYSTOLIC) 5 MG tablet Take 1 tablet (5 mg total) by mouth daily.   No current facility-administered medications for this visit. (Other)      REVIEW OF SYSTEMS:    ALLERGIES Allergies  Allergen Reactions   Ace Inhibitors Cough   Penicillins Hives and Rash    PAST MEDICAL HISTORY Past Medical History:  Diagnosis Date   Cataract    Hypertension    Obesity    Past Surgical History:  Procedure Laterality Date   CATARACT EXTRACTION     TONSILLECTOMY      FAMILY HISTORY Family History  Problem Relation Age of Onset   Pancreatic cancer Sister    Stomach  cancer Father    Hypertension Other    Lung cancer Maternal Aunt     SOCIAL HISTORY Social History   Tobacco Use   Smoking status: Never   Smokeless tobacco: Never  Substance Use Topics   Alcohol use: Not Currently    Comment: rarely         OPHTHALMIC EXAM:  Base Eye Exam     Visual Acuity (ETDRS)       Right Left   Dist [place] 20/30 -2 20/60 -1+2   Dist ph [place]  NI         Tonometry (Tonopen, 8:21 AM)       Right Left   Pressure 14 15         Pupils       Pupils Dark Light APD   Right PERRL 5 4 None   Left PERRL 5 4 None         Extraocular Movement       Right Left    Full Full         Neuro/Psych     Oriented x3: Yes   Mood/Affect: Normal         Dilation     Both eyes:  1.0% Mydriacyl, 2.5% Phenylephrine @ 8:21 AM           Slit Lamp and Fundus Exam     External Exam       Right Left   External Normal Normal         Slit Lamp Exam       Right Left   Lids/Lashes Normal Normal   Conjunctiva/Sclera White and quiet White and quiet   Cornea Clear Clear   Anterior Chamber Deep and quiet Deep and quiet   Iris Round and reactive Round and reactive   Lens Centered posterior chamber intraocular lens Centered posterior chamber intraocular lens   Anterior Vitreous Normal Normal         Fundus Exam       Right Left   Posterior Vitreous Posterior vitreous detachment Posterior vitreous detachment   Disc Normal Normal   C/D Ratio 0.35 0.35   Macula Retinal pigment epithelial mottling, hard drusen, Early age related macular degeneration Retinal pigment epithelial mottling, hard drusen, Early age related macular degeneration   Vessels no DR no DR   Periphery Normal Normal            IMAGING AND PROCEDURES  Imaging and Procedures for 10/21/21  OCT, Retina - OU - Both Eyes       Right Eye Quality was good. Scan locations included subfoveal. Central Foveal Thickness: 294. Progression has been stable. Findings include  epiretinal membrane.   Left Eye Quality was good. Scan locations included subfoveal. Central Foveal Thickness: 279. Progression has been stable. Findings include epiretinal membrane.   Notes Large subfoveal drusenoid deposits noted some 6 months previous have completely resolved, but the only interval change has been the patient has treated what was found to have undiagnosed and untreated severe OSA per her self-report and has now been on CPAP therapy for only 2.5 months             ASSESSMENT/PLAN:  Snores Upon last visit in the office here May 2022, patient follow-up with her PCP for positive review of systems for sleep apnea.  In fact she was found to have severe sleep apnea.  Therapy was ordered, machinery was supply chain restricted for availability until beginning of September 2022  She began CPAP therapy 2022 and and now has fewer awakenings for nocturia or otherwise.  Does not notice a real change in energy however.  OSA on CPAP Upon last visit in the office here May 2022, patient follow-up with her PCP for positive review of systems for sleep apnea.  In fact she was found to have severe sleep apnea.  Therapy was ordered, machinery was supply chain restricted for availability until beginning of September 2022  She began CPAP therapy 2022 and and now has fewer awakenings for nocturia or otherwise.  Does not notice a real change in energy however.  Please note coincident large subfoveal drusenoid pigment epithelial detachment months subfoveal in each eye have now resolved, concomitant with institution of nightly CPAP for what was diagnosed with severe OSA  Intermediate stage nonexudative age-related macular degeneration of both eyes Large subfoveal drusenoid pigment epithelial detachment like deposits subfoveal in each eye noted 6 months ago now have nearly completely resolved, coincident with CPAP use for severe OSA for the last 2-1/57-month     ICD-10-CM   1. Bilateral  nonexudative age-related macular degeneration, unspecified stage  H35.3130 OCT, Retina - OU - Both Eyes    2. Snores  R06.83  3. Obstructive sleep apnea syndrome  G47.33     4. Intermediate stage nonexudative age-related macular degeneration of both eyes  H35.3132       1.  I have recommended the patient to continue on CPAP to prevent nightly hypoxic damage to the retina, particularly the macula, and as well the CNS system.  2.  Her ARMD while not completely gone, the large high risk subfoveal drusenoid deposits did resolve  3.  Ophthalmic Meds Ordered this visit:  No orders of the defined types were placed in this encounter.      Return in about 6 months (around 04/20/2022) for DILATE OU, COLOR FP, OCT.  There are no Patient Instructions on file for this visit.   Explained the diagnoses, plan, and follow up with the patient and they expressed understanding.  Patient expressed understanding of the importance of proper follow up care.   Clent Demark Latreshia Beauchaine M.D. Diseases & Surgery of the Retina and Vitreous Retina & Diabetic [place] 10/21/21     Abbreviations: M myopia (nearsighted); A astigmatism; H hyperopia (farsighted); P presbyopia; Mrx spectacle prescription;  CTL contact lenses; OD right eye; OS left eye; OU both eyes  XT exotropia; ET esotropia; PEK punctate epithelial keratitis; PEE punctate epithelial erosions; DES dry eye syndrome; MGD meibomian gland dysfunction; ATs artificial tears; PFAT's preservative free artificial tears; [place] nuclear sclerotic cataract; PSC posterior subcapsular cataract; ERM epi-retinal membrane; PVD posterior vitreous detachment; RD retinal detachment; DM diabetes mellitus; DR diabetic retinopathy; NPDR non-proliferative diabetic retinopathy; PDR proliferative diabetic retinopathy; CSME clinically significant macular edema; DME diabetic macular edema; dbh dot blot hemorrhages; CWS cotton wool spot; POAG primary open angle glaucoma; C/D  cup-to-disc ratio; HVF humphrey visual field; GVF goldmann visual field; OCT optical coherence tomography; IOP intraocular pressure; BRVO Branch retinal vein occlusion; CRVO central retinal vein occlusion; CRAO central retinal artery occlusion; BRAO branch retinal artery occlusion; RT retinal tear; SB scleral buckle; PPV pars plana vitrectomy; VH Vitreous hemorrhage; PRP panretinal laser photocoagulation; IVK intravitreal kenalog; VMT vitreomacular traction; MH Macular hole;  NVD neovascularization of the disc; NVE neovascularization elsewhere; AREDS age related eye disease study; ARMD age related macular degeneration; POAG primary open angle glaucoma; EBMD epithelial/anterior basement membrane dystrophy; ACIOL anterior chamber intraocular lens; IOL intraocular lens; PCIOL posterior chamber intraocular lens; Phaco/IOL phacoemulsification with intraocular lens placement; [place] photorefractive keratectomy; LASIK laser assisted in situ keratomileusis; HTN hypertension; DM diabetes mellitus; COPD chronic obstructive pulmonary disease

## 2021-10-21 NOTE — Assessment & Plan Note (Signed)
Large subfoveal drusenoid pigment epithelial detachment like deposits subfoveal in each eye noted 6 months ago now have nearly completely resolved, coincident with CPAP use for severe OSA for the last 2-1/4-month

## 2021-11-06 ENCOUNTER — Encounter (INDEPENDENT_AMBULATORY_CARE_PROVIDER_SITE_OTHER): Payer: Self-pay

## 2021-11-18 DIAGNOSIS — G4733 Obstructive sleep apnea (adult) (pediatric): Secondary | ICD-10-CM | POA: Diagnosis not present

## 2021-11-18 DIAGNOSIS — I10 Essential (primary) hypertension: Secondary | ICD-10-CM | POA: Diagnosis not present

## 2021-12-31 ENCOUNTER — Ambulatory Visit
Admission: RE | Admit: 2021-12-31 | Discharge: 2021-12-31 | Disposition: A | Discharge status: Home / Self Care | Payer: Medicare HMO | Source: Ambulatory Visit | Attending: Family Medicine | Admitting: Family Medicine

## 2021-12-31 DIAGNOSIS — M8580 Other specified disorders of bone density and structure, unspecified site: Secondary | ICD-10-CM

## 2021-12-31 DIAGNOSIS — M8589 Other specified disorders of bone density and structure, multiple sites: Secondary | ICD-10-CM | POA: Diagnosis not present

## 2021-12-31 DIAGNOSIS — Z780 Asymptomatic menopausal state: Secondary | ICD-10-CM | POA: Diagnosis not present

## 2022-02-11 DIAGNOSIS — R001 Bradycardia, unspecified: Secondary | ICD-10-CM | POA: Diagnosis not present

## 2022-02-11 DIAGNOSIS — G250 Essential tremor: Secondary | ICD-10-CM | POA: Diagnosis not present

## 2022-02-11 DIAGNOSIS — I10 Essential (primary) hypertension: Secondary | ICD-10-CM | POA: Diagnosis not present

## 2022-02-22 ENCOUNTER — Emergency Department (HOSPITAL_COMMUNITY): Payer: Medicare HMO

## 2022-02-22 ENCOUNTER — Encounter (HOSPITAL_COMMUNITY): Payer: Self-pay

## 2022-02-22 ENCOUNTER — Other Ambulatory Visit: Payer: Self-pay

## 2022-02-22 ENCOUNTER — Inpatient Hospital Stay (HOSPITAL_COMMUNITY)
Admission: EM | Admit: 2022-02-22 | Discharge: 2022-02-25 | DRG: 242 | Disposition: A | Discharge status: Home / Self Care | Payer: Medicare HMO | Attending: Internal Medicine | Admitting: Internal Medicine

## 2022-02-22 ENCOUNTER — Inpatient Hospital Stay (HOSPITAL_COMMUNITY): Payer: Medicare HMO

## 2022-02-22 DIAGNOSIS — G4733 Obstructive sleep apnea (adult) (pediatric): Secondary | ICD-10-CM | POA: Diagnosis present

## 2022-02-22 DIAGNOSIS — R0602 Shortness of breath: Secondary | ICD-10-CM | POA: Diagnosis not present

## 2022-02-22 DIAGNOSIS — Z9849 Cataract extraction status, unspecified eye: Secondary | ICD-10-CM | POA: Diagnosis not present

## 2022-02-22 DIAGNOSIS — I5021 Acute systolic (congestive) heart failure: Secondary | ICD-10-CM | POA: Diagnosis not present

## 2022-02-22 DIAGNOSIS — R0600 Dyspnea, unspecified: Secondary | ICD-10-CM | POA: Diagnosis not present

## 2022-02-22 DIAGNOSIS — Z6830 Body mass index (BMI) 30.0-30.9, adult: Secondary | ICD-10-CM | POA: Diagnosis not present

## 2022-02-22 DIAGNOSIS — E669 Obesity, unspecified: Secondary | ICD-10-CM | POA: Diagnosis present

## 2022-02-22 DIAGNOSIS — I509 Heart failure, unspecified: Secondary | ICD-10-CM

## 2022-02-22 DIAGNOSIS — R079 Chest pain, unspecified: Secondary | ICD-10-CM | POA: Diagnosis not present

## 2022-02-22 DIAGNOSIS — J9811 Atelectasis: Secondary | ICD-10-CM | POA: Diagnosis not present

## 2022-02-22 DIAGNOSIS — Z880 Allergy status to penicillin: Secondary | ICD-10-CM | POA: Diagnosis not present

## 2022-02-22 DIAGNOSIS — I459 Conduction disorder, unspecified: Secondary | ICD-10-CM

## 2022-02-22 DIAGNOSIS — J9601 Acute respiratory failure with hypoxia: Secondary | ICD-10-CM | POA: Diagnosis not present

## 2022-02-22 DIAGNOSIS — Z8249 Family history of ischemic heart disease and other diseases of the circulatory system: Secondary | ICD-10-CM | POA: Diagnosis not present

## 2022-02-22 DIAGNOSIS — I110 Hypertensive heart disease with heart failure: Principal | ICD-10-CM | POA: Diagnosis present

## 2022-02-22 DIAGNOSIS — I5031 Acute diastolic (congestive) heart failure: Secondary | ICD-10-CM | POA: Diagnosis not present

## 2022-02-22 DIAGNOSIS — I10 Essential (primary) hypertension: Secondary | ICD-10-CM | POA: Diagnosis not present

## 2022-02-22 DIAGNOSIS — Z79899 Other long term (current) drug therapy: Secondary | ICD-10-CM | POA: Diagnosis not present

## 2022-02-22 DIAGNOSIS — R001 Bradycardia, unspecified: Secondary | ICD-10-CM | POA: Diagnosis not present

## 2022-02-22 DIAGNOSIS — Z888 Allergy status to other drugs, medicaments and biological substances status: Secondary | ICD-10-CM | POA: Diagnosis not present

## 2022-02-22 DIAGNOSIS — Z950 Presence of cardiac pacemaker: Secondary | ICD-10-CM

## 2022-02-22 DIAGNOSIS — Z7984 Long term (current) use of oral hypoglycemic drugs: Secondary | ICD-10-CM

## 2022-02-22 DIAGNOSIS — I442 Atrioventricular block, complete: Secondary | ICD-10-CM | POA: Diagnosis not present

## 2022-02-22 DIAGNOSIS — E119 Type 2 diabetes mellitus without complications: Secondary | ICD-10-CM | POA: Diagnosis present

## 2022-02-22 DIAGNOSIS — R0902 Hypoxemia: Secondary | ICD-10-CM | POA: Diagnosis present

## 2022-02-22 DIAGNOSIS — I4510 Unspecified right bundle-branch block: Secondary | ICD-10-CM | POA: Diagnosis present

## 2022-02-22 DIAGNOSIS — E785 Hyperlipidemia, unspecified: Secondary | ICD-10-CM | POA: Diagnosis not present

## 2022-02-22 DIAGNOSIS — Z20822 Contact with and (suspected) exposure to covid-19: Secondary | ICD-10-CM | POA: Diagnosis not present

## 2022-02-22 LAB — HEPATIC FUNCTION PANEL
ALT: 14 U/L (ref 0–44)
AST: 19 U/L (ref 15–41)
Albumin: 3.6 g/dL (ref 3.5–5.0)
Alkaline Phosphatase: 31 U/L — ABNORMAL LOW (ref 38–126)
Bilirubin, Direct: 0.2 mg/dL (ref 0.0–0.2)
Indirect Bilirubin: 1 mg/dL — ABNORMAL HIGH (ref 0.3–0.9)
Total Bilirubin: 1.2 mg/dL (ref 0.3–1.2)
Total Protein: 6.7 g/dL (ref 6.5–8.1)

## 2022-02-22 LAB — BRAIN NATRIURETIC PEPTIDE: B Natriuretic Peptide: 978 pg/mL — ABNORMAL HIGH (ref 0.0–100.0)

## 2022-02-22 LAB — GLUCOSE, CAPILLARY
Glucose-Capillary: 191 mg/dL — ABNORMAL HIGH (ref 70–99)
Glucose-Capillary: 168 mg/dL — ABNORMAL HIGH (ref 70–99)

## 2022-02-22 LAB — BASIC METABOLIC PANEL
Anion gap: 17 — ABNORMAL HIGH (ref 5–15)
BUN: 35 mg/dL — ABNORMAL HIGH (ref 8–23)
CO2: 13 mmol/L — ABNORMAL LOW (ref 22–32)
Calcium: 9.9 mg/dL (ref 8.9–10.3)
Chloride: 101 mmol/L (ref 98–111)
Creatinine, Ser: 1.33 mg/dL — ABNORMAL HIGH (ref 0.44–1.00)
GFR, Estimated: 40 mL/min — ABNORMAL LOW (ref >60)
Glucose, Bld: 270 mg/dL — ABNORMAL HIGH (ref 70–99)
Potassium: 5 mmol/L (ref 3.5–5.1)
Sodium: 131 mmol/L — ABNORMAL LOW (ref 135–145)

## 2022-02-22 LAB — LACTIC ACID, PLASMA
Lactic Acid, Venous: 2.9 mmol/L (ref 0.5–1.9)
Lactic Acid, Venous: 2.6 mmol/L (ref 0.5–1.9)

## 2022-02-22 LAB — RESP PANEL BY RT-PCR (FLU A&B, COVID) ARPGX2
Influenza A by PCR: NEGATIVE
Influenza B by PCR: NEGATIVE
SARS Coronavirus 2 by RT PCR: NEGATIVE

## 2022-02-22 LAB — CBC
HCT: 41.4 % (ref 36.0–46.0)
HCT: 41.9 % (ref 36.0–46.0)
Hemoglobin: 13.7 g/dL (ref 12.0–15.0)
Hemoglobin: 13.8 g/dL (ref 12.0–15.0)
MCH: 28.8 pg (ref 26.0–34.0)
MCH: 28.5 pg (ref 26.0–34.0)
MCHC: 33.1 g/dL (ref 30.0–36.0)
MCHC: 32.9 g/dL (ref 30.0–36.0)
MCV: 87 fL (ref 80.0–100.0)
MCV: 86.4 fL (ref 80.0–100.0)
Platelets: 276 10*3/uL (ref 150–400)
Platelets: 255 10*3/uL (ref 150–400)
RBC: 4.76 MIL/uL (ref 3.87–5.11)
RBC: 4.85 MIL/uL (ref 3.87–5.11)
RDW: 13.6 % (ref 11.5–15.5)
RDW: 13.6 % (ref 11.5–15.5)
WBC: 13.2 10*3/uL — ABNORMAL HIGH (ref 4.0–10.5)
WBC: 10 10*3/uL (ref 4.0–10.5)
nRBC: 0 % (ref 0.0–0.2)
nRBC: 0 % (ref 0.0–0.2)

## 2022-02-22 LAB — ECHOCARDIOGRAM COMPLETE
AR max vel: 2.16 cm2
AV Area VTI: 2.09 cm2
AV Area mean vel: 1.82 cm2
AV Mean grad: 19 mmHg
AV Peak grad: 32.3 mmHg
Ao pk vel: 2.84 m/s
Height: 66 in
MV VTI: 1.38 cm2
S' Lateral: 3.1 cm
Weight: 2960 oz

## 2022-02-22 LAB — HEMOGLOBIN A1C
Hgb A1c MFr Bld: 7 % — ABNORMAL HIGH (ref 4.8–5.6)
Mean Plasma Glucose: 154.2 mg/dL

## 2022-02-22 LAB — TROPONIN I (HIGH SENSITIVITY)
Troponin I (High Sensitivity): 27 ng/L — ABNORMAL HIGH (ref <18)
Troponin I (High Sensitivity): 29 ng/L — ABNORMAL HIGH (ref <18)
Troponin I (High Sensitivity): 35 ng/L — ABNORMAL HIGH (ref <18)

## 2022-02-22 LAB — MRSA NEXT GEN BY PCR, NASAL: MRSA by PCR Next Gen: NOT DETECTED

## 2022-02-22 LAB — T4, FREE: Free T4: 0.87 ng/dL (ref 0.61–1.12)

## 2022-02-22 LAB — CREATININE, SERUM
Creatinine, Ser: 1.31 mg/dL — ABNORMAL HIGH (ref 0.44–1.00)
GFR, Estimated: 41 mL/min — ABNORMAL LOW (ref >60)

## 2022-02-22 LAB — MAGNESIUM: Magnesium: 1.7 mg/dL (ref 1.7–2.4)

## 2022-02-22 LAB — TSH: TSH: 6.91 u[IU]/mL — ABNORMAL HIGH (ref 0.350–4.500)

## 2022-02-22 MED ORDER — SODIUM CHLORIDE 0.9% FLUSH: 3.0000 mL | INTRAVENOUS | Status: DC | PRN

## 2022-02-22 MED ORDER — LOSARTAN POTASSIUM 50 MG PO TABS: 25.0000 mg | ORAL_TABLET | Freq: Every day | ORAL | Status: DC

## 2022-02-22 MED ORDER — FUROSEMIDE 10 MG/ML IJ SOLN
40.0000 mg | Freq: Every day | INTRAMUSCULAR | Status: DC
  Administered 2022-02-23 – 2022-02-24 (×2): 40 mg via INTRAVENOUS
  Filled 2022-02-22 (×2): qty 4

## 2022-02-22 MED ORDER — ASPIRIN EC 81 MG PO TBEC
81.0000 mg | DELAYED_RELEASE_TABLET | Freq: Every day | ORAL | Status: DC
  Administered 2022-02-22 – 2022-02-25 (×4): 81 mg via ORAL
  Filled 2022-02-22 (×4): qty 1

## 2022-02-22 MED ORDER — INSULIN ASPART 100 UNIT/ML IJ SOLN
0.0000 [IU] | Freq: Every day | INTRAMUSCULAR | Status: DC
  Administered 2022-02-24: 3 [IU] via SUBCUTANEOUS

## 2022-02-22 MED ORDER — SODIUM CHLORIDE 0.9 % IV SOLN: 250.0000 mL | INTRAVENOUS | Status: DC | PRN

## 2022-02-22 MED ORDER — FUROSEMIDE 10 MG/ML IJ SOLN
20.0000 mg | Freq: Two times a day (BID) | INTRAMUSCULAR | Status: DC
Start: 2022-02-22 — End: 2022-02-22

## 2022-02-22 MED ORDER — ACETAMINOPHEN 325 MG PO TABS: 650.0000 mg | ORAL_TABLET | ORAL | Status: DC | PRN

## 2022-02-22 MED ORDER — ONDANSETRON HCL 4 MG/2ML IJ SOLN: 4.0000 mg | Freq: Four times a day (QID) | INTRAMUSCULAR | Status: DC | PRN

## 2022-02-22 MED ORDER — AMLODIPINE BESYLATE 10 MG PO TABS
10.0000 mg | ORAL_TABLET | Freq: Every day | ORAL | Status: DC
  Administered 2022-02-23 – 2022-02-25 (×3): 10 mg via ORAL
  Filled 2022-02-22 (×3): qty 1

## 2022-02-22 MED ORDER — INSULIN ASPART 100 UNIT/ML IJ SOLN
0.0000 [IU] | Freq: Three times a day (TID) | INTRAMUSCULAR | Status: DC
  Administered 2022-02-22 – 2022-02-23 (×4): 2 [IU] via SUBCUTANEOUS
  Administered 2022-02-24: 5 [IU] via SUBCUTANEOUS
  Administered 2022-02-24 – 2022-02-25 (×2): 2 [IU] via SUBCUTANEOUS

## 2022-02-22 MED ORDER — SODIUM CHLORIDE 0.9% FLUSH
3.0000 mL | Freq: Two times a day (BID) | INTRAVENOUS | Status: DC
Start: 2022-02-22 — End: 2022-02-25
  Administered 2022-02-22 – 2022-02-25 (×7): 3 mL via INTRAVENOUS

## 2022-02-22 MED ORDER — FUROSEMIDE 10 MG/ML IJ SOLN
20.0000 mg | Freq: Once | INTRAMUSCULAR | Status: AC
  Administered 2022-02-22: 20 mg via INTRAVENOUS
  Filled 2022-02-22: qty 2

## 2022-02-22 MED ORDER — CHLORHEXIDINE GLUCONATE CLOTH 2 % EX PADS
6.0000 | MEDICATED_PAD | Freq: Every day | CUTANEOUS | Status: DC
  Administered 2022-02-23 – 2022-02-24 (×2): 6 via TOPICAL

## 2022-02-22 MED ORDER — SIMVASTATIN 20 MG PO TABS
20.0000 mg | ORAL_TABLET | Freq: Every day | ORAL | Status: DC
Start: 2022-02-23 — End: 2022-02-25
  Administered 2022-02-23 – 2022-02-25 (×3): 20 mg via ORAL
  Filled 2022-02-22 (×3): qty 1

## 2022-02-22 MED ORDER — HEPARIN SODIUM (PORCINE) 5000 UNIT/ML IJ SOLN
5000.0000 [IU] | Freq: Three times a day (TID) | INTRAMUSCULAR | Status: DC
  Administered 2022-02-22 – 2022-02-23 (×5): 5000 [IU] via SUBCUTANEOUS
  Filled 2022-02-22 (×5): qty 1

## 2022-02-22 NOTE — ED Notes (Signed)
Pt brought back to triage for a repeat EKG per Dr. Armandina Gemma. When approaching pt in lobby she was visibly nauseas, diaphoretic. No c/o CP. Pt placed on 4 lpm via O2 via [place]. ?

## 2022-02-22 NOTE — ED Provider Notes (Signed)
?[facility] ?Provider Note ? ? ?CSN: [id number] ?Arrival date & time: 02/22/22  7209 ? ?  ? ?History ? ?Chief Complaint  ?Patient presents with  ? Shortness of Breath  ? ? ?Sheri Moreno is a 81 y.o. female with past medical history of hypertension, diabetes mellitus, hyperlipidemia.  Presents emergency department with a chief complaint of shortness of breath.  Patient reports that she has had shortness of breath since the middle of January.  This has gotten progressively worse.  Patient has noticed acute worsening over the last few days.  Patient states that she is only having shortness of breath with exertion.  She denies any associated chest pain with exertion.  She has noticed that she has been feeling lightheaded with exertion over the last few days.  Patient has also noted swelling to bilateral ankles in the last 24 hours.  Patient reports that she went to her primary care doctor's office earlier this week for similar complaints.  She was found to be bradycardic and taken off for beta-blocker.  Patient was started on amlodipine.  Additionally patient endorses fatigue, hair loss, and loose stools. ? ?Patient reports that she does not use any home oxygen. ? ?Denies any fever, chills, cough, chest pain, palpitations, abdominal pain, nausea, vomiting, diarrhea, blood in stool, melena, dysuria, hematuria, urinary urgency, vaginal pain, vaginal bleeding, vaginal discharge, numbness, weakness, syncope. ? ? ?Shortness of Breath ?Associated symptoms: no abdominal pain, no chest pain, no cough, no fever, no headaches, no neck pain, no rash and no vomiting   ? ?  ? ?Home Medications ?Prior to Admission medications   ?Medication Sig Start Date End Date Taking? Authorizing Provider  ?calcium carbonate (OS-CAL - DOSED IN MG OF ELEMENTAL CALCIUM) 1250 MG tablet Take 1 tablet by mouth daily.      [provider]  ?Misc Natural Products (OSTEO BI-FLEX ADV JOINT SHIELD PO) Take  1 tablet by mouth daily.      [provider]  ?nebivolol (BYSTOLIC) 5 MG tablet Take 1 tablet (5 mg total) by mouth daily. 12/16/11   Parrett, Fonnie Mu, NP  ?   ? ?Allergies    ?Ace inhibitors and Penicillins   ? ?Review of Systems   ?Review of Systems  ?Constitutional:  Negative for chills and fever.  ?Eyes:  Negative for visual disturbance.  ?Respiratory:  Positive for shortness of breath. Negative for cough.   ?Cardiovascular:  Positive for leg swelling. Negative for chest pain and palpitations.  ?Gastrointestinal:  Negative for abdominal pain, nausea and vomiting.  ?Genitourinary:  Negative for difficulty urinating, dysuria, frequency, hematuria, urgency, vaginal bleeding, vaginal discharge and vaginal pain.  ?Musculoskeletal:  Negative for back pain and neck pain.  ?Skin:  Negative for color change and rash.  ?Neurological:  Negative for dizziness, syncope, light-headedness and headaches.  ?Psychiatric/Behavioral:  Negative for confusion.   ? ?Physical Exam ?Updated Vital Signs ?BP (!) 165/51 (BP Location: Right Arm)   Pulse (!) 47   Temp 97.8 ?F (36.6 ?C) (Oral)   Resp 16   SpO2 95%  ?Physical Exam ?Vitals and nursing note reviewed.  ?Constitutional:   ?   General: She is not in acute distress. ?   Appearance: She is not ill-appearing, toxic-appearing or diaphoretic.  ?   Interventions: Nasal cannula in place.  ?   Comments: On 4 LPM of O2 via nasal cannula  ?HENT:  ?   Head: Normocephalic.  ?Eyes:  ?   General: No  scleral icterus.    ?   Right eye: No discharge.     ?   Left eye: No discharge.  ?Cardiovascular:  ?   Rate and Rhythm: Bradycardia present.  ?   Pulses:     ?     Radial pulses are 2+ on the right side and 2+ on the left side.  ?   Heart sounds: Normal heart sounds and S2 normal.  ?   Comments: Bradycardic at rate of 49 ?Pulmonary:  ?   Effort: Pulmonary effort is normal. No tachypnea, bradypnea or respiratory distress.  ?   Breath sounds: Normal breath sounds. Decreased air movement  present.  ?   Comments: Speaks in full sentences without difficulty, lungs clear to auscultation however decreased air movement throughout ?Abdominal:  ?   General: There is no distension. There are no signs of injury.  ?   Palpations: Abdomen is soft. There is no mass or pulsatile mass.  ?   Tenderness: There is no abdominal tenderness. There is no guarding or rebound.  ?Musculoskeletal:  ?   Cervical back: Neck supple.  ?   Right lower leg: No swelling, deformity, lacerations, tenderness or bony tenderness. 1+ Edema present.  ?   Left lower leg: No swelling, deformity, lacerations, tenderness or bony tenderness. 1+ Edema present.  ?Skin: ?   General: Skin is warm and dry.  ?Neurological:  ?   General: No focal deficit present.  ?   Mental Status: She is alert.  ?Psychiatric:     ?   Behavior: Behavior is cooperative.  ? ? ?ED Results / Procedures / Treatments   ?Labs ?(all labs ordered are listed, but only abnormal results are displayed) ?Labs Reviewed  ?BASIC METABOLIC PANEL - Abnormal; Notable for the following components:  ?    Result Value  ? Sodium 131 (*)   ? CO2 13 (*)   ? Glucose, Bld 270 (*)   ? BUN 35 (*)   ? Creatinine, Ser 1.33 (*)   ? GFR, Estimated 40 (*)   ? Anion gap 17 (*)   ? All other components within normal limits  ?CBC - Abnormal; Notable for the following components:  ? WBC 13.2 (*)   ? All other components within normal limits  ?BRAIN NATRIURETIC PEPTIDE - Abnormal; Notable for the following components:  ? B Natriuretic Peptide 978.0 (*)   ? All other components within normal limits  ?TROPONIN I (HIGH SENSITIVITY) - Abnormal; Notable for the following components:  ? Troponin I (High Sensitivity) 27 (*)   ? All other components within normal limits  ?RESP PANEL BY RT-PCR (FLU A&B, COVID) ARPGX2  ?TSH  ?T4, FREE  ?T3, FREE  ?HEPATIC FUNCTION PANEL  ?LACTIC ACID, PLASMA  ?LACTIC ACID, PLASMA  ?TROPONIN I (HIGH SENSITIVITY)  ? ? ?EKG ?EKG Interpretation ? ?Date/Time:  Saturday February 22 2022  08:57:15 EDT ?Ventricular Rate:  51 ?PR Interval:    ?QRS Duration: 136 ?QT Interval:  510 ?QTC Calculation: 470 ?R Axis:   -11 ?Text Interpretation: Sinus bradycardia Wide QRS rhythm Left axis deviation Right bundle branch block Abnormal ECG When compared with ECG of 22-Feb-2022 08:24, PREVIOUS ECG IS PRESENT Confirmed by Regan Lemming ([id number]) on 02/22/2022 10:57:33 AM ? ?Radiology ?DG Chest 2 View ? ?Result Date: 02/22/2022 ?CLINICAL DATA:  Dyspnea on exertion. EXAM: CHEST - 2 VIEW COMPARISON:  None. FINDINGS: Lungs are mildly hyperexpanded. Interstitial markings are diffusely coarsened with chronic features. Left base atelectasis/infiltrate noted. Streaky opacity  in the parahilar right lung suggests atelectasis or scarring possible tiny pleural effusions. Bones are demineralized. Telemetry leads overlie the chest. IMPRESSION: 1. Left base atelectasis/infiltrate. 2. Streaky opacity in the parahilar right lung suggests atelectasis or scarring. 3. Interstitial prominence diffusely in both lungs may be chronic but component of interstitial edema not excluded. Electronically Signed   By: Misty Stanley M.D.   On: 02/22/2022 11:09   ? ?Procedures ?[facility] KitchenCritical Care ?Performed by: Loni Beckwith, PA-C ?Authorized by: Loni Beckwith, PA-C  ? ?Critical care provider statement:  ?  Critical care time (minutes):  30 ?  Critical care was necessary to treat or prevent imminent or life-threatening deterioration of the following conditions:  Respiratory failure ?  Critical care was time spent personally by me on the following activities:  Development of treatment plan with patient or surrogate, discussions with consultants, evaluation of patient's response to treatment, examination of patient, ordering and review of laboratory studies, ordering and review of radiographic studies, ordering and performing treatments and interventions, pulse oximetry, re-evaluation of patient's condition, review of old charts and obtaining  history from patient or surrogate ?Comments:  ?   Acute respiratory failure with hypoxia and new oxygen requirement  ? ? ?Medications Ordered in ED ?Medications  ?furosemide (LASIX) injection 20 mg (has no admi

## 2022-02-22 NOTE — ED Notes (Signed)
HICU nurse states she will take pt upstairs, just need a tech to go with her to return bed.  ?

## 2022-02-22 NOTE — Progress Notes (Signed)
?  Echocardiogram ?2D Echocardiogram has been performed. ? ?Elmer Ramp ?02/22/2022, 3:53 PM ?

## 2022-02-22 NOTE — ED Triage Notes (Signed)
Pt arrives c/o worsening DOE and SOB over the last few weeks. Worsening swelling of feet/ankles. No hx of CHF per pt. Pt denies CP, N/V, diaphoresis.  ?

## 2022-02-22 NOTE — Plan of Care (Signed)
All questions answered, call bell within reach ? ?

## 2022-02-22 NOTE — H&P (Addendum)
?Cardiology Consultation:  ? ?Patient ID: Sheri Moreno ?MRN: [id number]; DOB: April 06, 1941 ? ?Admit date: 02/22/2022 ?Date of Consult: 02/22/2022 ? ?PCP:  Donald Prose, MD ?  ?[facility] Providers ?Cardiologist:  None      ? ?CC:  weakness DOE  ?Patient Profile:  ? ?Sheri Moreno is a 81 y.o. female with a hx of HTN, DM2, HLD, OSA, obesity who is being seen 02/22/2022 for the evaluation of DOE, and edema increasing over last 2 weeks at least and CHB at the request of Dr. Armandina Gemma. ? ?History of Present Illness:  ? ?Sheri Moreno presented to ER with DOE that has increased over last several weeks.  Actually since the middle of Jan.  Over last few days has increased.  She has also developed lightheadedness in last few days with exertion.   She was seen by PCP earlier this week and she was bradycardic and her bystolic was stopped.  She was placed on amlodipine.   She also complained of fatigue, hair loss and loose stools. No chest pain.   ? ?Na 131, K+ 5.01 BUN 35 Cr 1.33 anion gap 17 glucose 270. ? ?BNP 978 ?Hs troponin 27 and 29 ?Lactic acid 2.9 ?WBC 13.2 Hgb 13.7  ?TSH  6.910 ?Free T4 0.87  ? ?2V CXR    ?IMPRESSION: ?1. Left base atelectasis/infiltrate. ?2. Streaky opacity in the parahilar right lung suggests atelectasis ?or scarring. ?3. Interstitial prominence diffusely in both lungs may be chronic ?but component of interstitial edema not excluded. ?  ?EKG:  The EKG was personally reviewed and demonstrates:  bradycardia at 49 with artifact at baseline could be CHB or a flutter LAD and RBBB.  ?Telemetry:  Telemetry was personally reviewed and demonstrates:  CHB HR 44 ? ?BP 171/54 P 49 with dips to 37 afebrile  ?RA sp02 92-93%  now on 02 ? ?She has rec'd lasix 20 mg here.  ? ?Past Medical History:  ?Diagnosis Date  ? Cataract   ? Hypertension   ? Obesity   ? ? ?Past Surgical History:  ?Procedure Laterality Date  ? CATARACT EXTRACTION    ? TONSILLECTOMY    ?  ? ? Outpt meds,  Bystolic stopped earlier in  week ?Amlodiipine, metforman, zocor glipizide  ? ?Inpatient Medications: ?Scheduled Meds: ? furosemide  20 mg Intravenous Once  ? ?Continuous Infusions: ? ?PRN Meds: ? ? ?Allergies:    ?Allergies  ?Allergen Reactions  ? Ace Inhibitors Cough  ? Clindamycin Hcl Rash  ? Ethyl Chloride Rash  ? Penicillins Hives and Rash  ? ? ?Social History:   ?Social History  ? ?Socioeconomic History  ? Marital status: Single  ?  Spouse name: Not on file  ? Number of children: Not on file  ? Years of education: Not on file  ? Highest education level: Not on file  ?Occupational History  ? Occupation: Ecologist  ?Tobacco Use  ? Smoking status: Never  ? Smokeless tobacco: Never  ?Substance and Sexual Activity  ? Alcohol use: Not Currently  ?  Comment: rarely  ? Drug use: Not on file  ? Sexual activity: Not on file  ?Other Topics Concern  ? Not on file  ?Social History Narrative  ? Not on file  ? ?Social Determinants of Health  ? ?Financial Resource Strain: Not on file  ?Food Insecurity: Not on file  ?Transportation Needs: Not on file  ?Physical Activity: Not on file  ?Stress: Not on file  ?Social Connections: Not on file  ?  Intimate Partner Violence: Not on file  ?  ?Family History:   ? ?Family History  ?Problem Relation Age of Onset  ? Pancreatic cancer Sister   ? Stomach cancer Father   ? Hypertension Other   ? Lung cancer Maternal Aunt   ?  ? ?ROS:  ?Please see the history of present illness.  ?General:no colds or fevers, no weight changes ?Skin:no rashes or ulcers ?HEENT:no blurred vision, no congestion ?CV:see HPI ?PUL:see HPI ?GI:no diarrhea constipation or melena, no indigestion ?GU:no hematuria, no dysuria ?MS:no joint pain, no claudication ?Neuro:no syncope, no lightheadedness ?Endo:+ diabetes, no thyroid disease ? ?All other ROS reviewed and negative.    ? ?Physical Exam/Data:  ? ?Vitals:  ? 02/22/22 0837 02/22/22 0914 02/22/22 1130  ?BP: (!) 171/54 (!) 165/51 (!) 152/47  ?Pulse: (!) 49 (!) 47 (!) 37  ?Resp: '18 16 19   '$ ?Temp: 98.2 ?F (36.8 ?C) 97.8 ?F (36.6 ?C)   ?TempSrc: Oral Oral   ?SpO2: 92% 95% 92%  ? ?No intake or output data in the 24 hours ending 02/22/22 1243 ?Last 3 Weights 01/11/2013 09/29/2011 03/17/2011  ?Weight (lbs) 204 lb 12.8 oz 195 lb 6.4 oz 191 lb  ?Weight (kg) 92.897 kg 88.633 kg 86.637 kg  ?   ?There is no height or weight on file to calculate BMI.  ?EXAM per Dr. Harl Bowie ? ? ?Relevant CV Studies: ?none ? ?Laboratory Data: ? ?High Sensitivity Troponin:   ?Recent Labs  ?Lab 02/22/22 ?1517 02/22/22 ?1045  ?TROPONINIHS 27* 29*  ?   ?Chemistry ?Recent Labs  ?Lab 02/22/22 ?0845  ?NA 131*  ?K 5.0  ?CL 101  ?CO2 13*  ?GLUCOSE 270*  ?BUN 35*  ?CREATININE 1.33*  ?CALCIUM 9.9  ?GFRNONAA 40*  ?ANIONGAP 17*  ?  ?Recent Labs  ?Lab 02/22/22 ?1045  ?PROT 6.7  ?ALBUMIN 3.6  ?AST 19  ?ALT 14  ?ALKPHOS 31*  ?BILITOT 1.2  ? ?Lipids No results for input(s): CHOL, TRIG, HDL, LABVLDL, LDLCALC, CHOLHDL in the last 168 hours.  ?Hematology ?Recent Labs  ?Lab 02/22/22 ?0845  ?WBC 13.2*  ?RBC 4.76  ?HGB 13.7  ?HCT 41.4  ?MCV 87.0  ?MCH 28.8  ?MCHC 33.1  ?RDW 13.6  ?PLT 276  ? ?Thyroid No results for input(s): TSH, FREET4 in the last 168 hours.  ?BNP ?Recent Labs  ?Lab 02/22/22 ?0845  ?BNP 978.0*  ?  ?DDimer No results for input(s): DDIMER in the last 168 hours. ? ? ?Radiology/Studies:  ?DG Chest 2 View ? ?Result Date: 02/22/2022 ?CLINICAL DATA:  Dyspnea on exertion. EXAM: CHEST - 2 VIEW COMPARISON:  None. FINDINGS: Lungs are mildly hyperexpanded. Interstitial markings are diffusely coarsened with chronic features. Left base atelectasis/infiltrate noted. Streaky opacity in the parahilar right lung suggests atelectasis or scarring possible tiny pleural effusions. Bones are demineralized. Telemetry leads overlie the chest. IMPRESSION: 1. Left base atelectasis/infiltrate. 2. Streaky opacity in the parahilar right lung suggests atelectasis or scarring. 3. Interstitial prominence diffusely in both lungs may be chronic but component of interstitial  edema not excluded. Electronically Signed   By: Misty Stanley M.D.   On: 02/22/2022 11:09   ? ? ?Assessment and Plan:  ? 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 Lovena Le with EP has been notified and will eval tomorrow. ?  Acute CHF has rec'd lasix 20 mg IV may be due to low HR , troponin mildly elevated may be from CHB, CHF.   Will check echo will continue with I lasix.  ?Elevated WBC and lactic acid possible UTI vs other area of infection.   ?Elevated TSH free T4 is normal ?HTN elevated continue amlodipine ?DM-2 hold metformn and glipizide add SSI ? ? ?Risk Assessment/Risk Scores:  ?   ?  ?  ?Severity of Illness: ?The appropriate patient status for this patient is INPATIENT. Inpatient status is judged to be reasonable and necessary in order to provide the required intensity of service to ensure the patient's safety. The patient's presenting symptoms, physical exam findings, and initial radiographic and laboratory data in the context of their chronic comorbidities is felt to place them at high risk for further clinical deterioration. Furthermore, it is not anticipated that the patient will be medically stable for discharge from the hospital within 2 midnights of admission.  ? ?* I certify that at the point of admission it is my clinical judgment that the patient will require inpatient hospital care spanning beyond 2 midnights from the point of admission due to high intensity of service, high risk for further deterioration and high frequency of surveillance required.*  ? ?For questions or updates, please contact [place] ?Please consult [email or web address] for contact info under  ? ?  ?Signed, ?Cecilie Kicks, NP  ?02/22/2022 1:22 PM   ? ?New York Heart Association (NYHA) Functional Class ?NYHA Class III ?  ? ?   ? ?For questions or updates, please contact [place] ?Please consult [email or web address] for contact info under  ? ? ?Signed, ?Cecilie Kicks, NP  ?02/22/2022 12:43 PM  ?

## 2022-02-23 DIAGNOSIS — I442 Atrioventricular block, complete: Secondary | ICD-10-CM

## 2022-02-23 DIAGNOSIS — I10 Essential (primary) hypertension: Secondary | ICD-10-CM

## 2022-02-23 LAB — CBC
HCT: 38.6 % (ref 36.0–46.0)
Hemoglobin: 12.8 g/dL (ref 12.0–15.0)
MCH: 28.5 pg (ref 26.0–34.0)
MCHC: 33.2 g/dL (ref 30.0–36.0)
MCV: 86 fL (ref 80.0–100.0)
Platelets: 254 10*3/uL (ref 150–400)
RBC: 4.49 MIL/uL (ref 3.87–5.11)
RDW: 13.8 % (ref 11.5–15.5)
WBC: 8.6 10*3/uL (ref 4.0–10.5)
nRBC: 0 % (ref 0.0–0.2)

## 2022-02-23 LAB — HEPATIC FUNCTION PANEL
ALT: 14 U/L (ref 0–44)
AST: 15 U/L (ref 15–41)
Albumin: 3.3 g/dL — ABNORMAL LOW (ref 3.5–5.0)
Alkaline Phosphatase: 26 U/L — ABNORMAL LOW (ref 38–126)
Bilirubin, Direct: 0.2 mg/dL (ref 0.0–0.2)
Indirect Bilirubin: 1.2 mg/dL — ABNORMAL HIGH (ref 0.3–0.9)
Total Bilirubin: 1.4 mg/dL — ABNORMAL HIGH (ref 0.3–1.2)
Total Protein: 6.4 g/dL — ABNORMAL LOW (ref 6.5–8.1)

## 2022-02-23 LAB — URINALYSIS, COMPLETE (UACMP) WITH MICROSCOPIC
Bacteria, UA: NONE SEEN
Bilirubin Urine: NEGATIVE
Glucose, UA: NEGATIVE mg/dL
Hgb urine dipstick: NEGATIVE
Ketones, ur: NEGATIVE mg/dL
Leukocytes,Ua: NEGATIVE
Nitrite: NEGATIVE
Protein, ur: NEGATIVE mg/dL
Specific Gravity, Urine: 1.006 (ref 1.005–1.030)
pH: 5 (ref 5.0–8.0)

## 2022-02-23 LAB — LIPID PANEL
Cholesterol: 156 mg/dL (ref 0–200)
HDL: 84 mg/dL (ref >40)
LDL Cholesterol: 59 mg/dL (ref 0–99)
Total CHOL/HDL Ratio: 1.9 RATIO
Triglycerides: 67 mg/dL (ref <150)
VLDL: 13 mg/dL (ref 0–40)

## 2022-02-23 LAB — GLUCOSE, CAPILLARY
Glucose-Capillary: 173 mg/dL — ABNORMAL HIGH (ref 70–99)
Glucose-Capillary: 165 mg/dL — ABNORMAL HIGH (ref 70–99)
Glucose-Capillary: 167 mg/dL — ABNORMAL HIGH (ref 70–99)
Glucose-Capillary: 159 mg/dL — ABNORMAL HIGH (ref 70–99)

## 2022-02-23 LAB — BASIC METABOLIC PANEL
Anion gap: 14 (ref 5–15)
BUN: 27 mg/dL — ABNORMAL HIGH (ref 8–23)
CO2: 18 mmol/L — ABNORMAL LOW (ref 22–32)
Calcium: 9.4 mg/dL (ref 8.9–10.3)
Chloride: 103 mmol/L (ref 98–111)
Creatinine, Ser: 1.18 mg/dL — ABNORMAL HIGH (ref 0.44–1.00)
GFR, Estimated: 46 mL/min — ABNORMAL LOW (ref >60)
Glucose, Bld: 176 mg/dL — ABNORMAL HIGH (ref 70–99)
Potassium: 4.5 mmol/L (ref 3.5–5.1)
Sodium: 135 mmol/L (ref 135–145)

## 2022-02-23 LAB — T3, FREE: T3, Free: 2.6 pg/mL (ref 2.0–4.4)

## 2022-02-23 MED ORDER — SPIRONOLACTONE 12.5 MG HALF TABLET
12.5000 mg | ORAL_TABLET | Freq: Every day | ORAL | Status: DC
  Administered 2022-02-23 – 2022-02-25 (×3): 12.5 mg via ORAL
  Filled 2022-02-23 (×3): qty 1

## 2022-02-23 NOTE — Consult Note (Signed)
? ?Cardiology Consultation:  ? ?Patient ID: Sheri Moreno ?MRN: [id number]; DOB: 06-14-1941 ? ?Admit date: 02/22/2022 ?Date of Consult: 02/23/2022 ? ?PCP:  Donald Prose, MD ?  ?[facility] Providers ?Cardiologist:  None   { ?  ? ? ?Patient Profile:  ? ?Sheri Moreno is a 81 y.o. female with a hx of HTN and sleep apnea who is being seen 02/23/2022 for the evaluation of CHB at the request of Dr. Harl Bowie. ? ?History of Present Illness:  ? ?Sheri Moreno is a pleasant 81 yo woman with a h/o the above problems noted that she had been more tired since January. She has not had syncope. 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 The patient denies anginal symptoms. She is still working. She is on no AV nodal blocking drugs.  ? ? ?Past Medical History:  ?Diagnosis Date  ? Cataract   ? Hypertension   ? Obesity   ? ? ?Past Surgical History:  ?Procedure Laterality Date  ? CATARACT EXTRACTION    ? TONSILLECTOMY    ?  ? ?Home Medications:  ?Prior to Admission medications   ?Medication Sig Start Date End Date Taking? Authorizing Provider  ?amLODipine (NORVASC) 10 MG tablet Take 10 mg by mouth daily. 02/17/22  Yes [provider]  ?Calcium Carb-Cholecalciferol 600-10 MG-MCG TABS Take 1 tablet by mouth daily.   Yes [provider]  ?glipiZIDE (GLUCOTROL) 10 MG tablet Take 10 mg by mouth daily before breakfast.   Yes [provider]  ?metFORMIN (GLUCOPHAGE-XR) 500 MG 24 hr tablet Take 1,000 mg by mouth daily with breakfast.   Yes [provider]  ?Misc Natural Products (OSTEO BI-FLEX ADV JOINT SHIELD PO) Take 1 tablet by mouth daily.     Yes [provider]  ?simvastatin (ZOCOR) 20 MG tablet Take 20 mg by mouth daily.   Yes [provider]  ?nebivolol (BYSTOLIC) 5 MG tablet Take 1 tablet (5 mg total) by mouth daily. ?Patient not taking: Reported on 02/22/2022 12/16/11   Parrett, Fonnie Mu, NP   ? ? ?Inpatient Medications: ?Scheduled Meds: ? amLODipine  10 mg Oral Daily  ? aspirin EC  81 mg Oral Daily  ? Chlorhexidine Gluconate Cloth  6 each Topical Daily  ? furosemide  40 mg Intravenous Daily  ? heparin  5,000 Units Subcutaneous Q8H  ? insulin aspart  0-5 Units Subcutaneous QHS  ? insulin aspart  0-9 Units Subcutaneous TID WC  ? simvastatin  20 mg Oral Daily  ? sodium chloride flush  3 mL Intravenous Q12H  ? spironolactone  12.5 mg Oral Daily  ? ?Continuous Infusions: ? sodium chloride    ? ?PRN Meds: ?sodium chloride, acetaminophen, ondansetron (ZOFRAN) IV, sodium chloride flush ? ?Allergies:    ?Allergies  ?Allergen Reactions  ? Ace Inhibitors Cough  ? Clindamycin Hcl Rash  ? Ethyl Chloride Rash  ? Penicillins Hives and Rash  ? ? ?Social History:   ?Social History  ? ?Socioeconomic History  ? Marital status: Single  ?  Spouse name: Not on file  ? Number of children: Not on file  ? Years of education: Not on file  ? Highest education level: Not on file  ?Occupational History  ? Occupation: Ecologist  ?Tobacco Use  ? Smoking status: Never  ? Smokeless tobacco: Never  ?Substance and Sexual Activity  ? Alcohol use: Not Currently  ?  Comment: rarely  ?  Drug use: Not on file  ? Sexual activity: Not on file  ?Other Topics Concern  ? Not on file  ?Social History Narrative  ? Not on file  ? ?Social Determinants of Health  ? ?Financial Resource Strain: Not on file  ?Food Insecurity: Not on file  ?Transportation Needs: Not on file  ?Physical Activity: Not on file  ?Stress: Not on file  ?Social Connections: Not on file  ?Intimate Partner Violence: Not on file  ?  ?Family History:   ?See below ?Family History  ?Problem Relation Age of Onset  ? Pancreatic cancer Sister   ? Stomach cancer Father   ? Hypertension Other   ? Lung cancer Maternal Aunt   ?  ? ?ROS:  ?Please see the history of present illness.  ? ?All other ROS reviewed and negative.    ? ?Physical Exam/Data:  ? ?Vitals:  ? 02/23/22 0930 02/23/22  0945 02/23/22 1000 02/23/22 1015  ?BP: (!) 146/50     ?Pulse: (!) 50  (!) 51 (!) 52  ?Resp: 16 (!) 47 17 17  ?Temp:      ?TempSrc:      ?SpO2: 94%  96% 97%  ?Weight:      ?Height:      ? ? ?Intake/Output Summary (Last 24 hours) at 02/23/2022 1129 ?Last data filed at 02/23/2022 1057 ?Gross per 24 hour  ?Intake 240 ml  ?Output 1800 ml  ?Net -1560 ml  ? ?Last 3 Weights 02/23/2022 02/23/2022 02/22/2022  ?Weight (lbs) 181 lb 3.5 oz 180 lb 12.4 oz 191 lb 9.3 oz  ?Weight (kg) 82.2 kg 82 kg 86.9 kg  ?   ?Body mass index is 29.25 kg/m?.  ?General:  Well nourished, well developed, elderly woman, in no acute distress ?HEENT: normal ?Neck: no JVD ?Vascular: No carotid bruits; Distal pulses 2+ bilaterally ?Cardiac:  normal S1, S2; Reg brady; no murmur  ?Lungs:  clear to auscultation bilaterally, no wheezing, rhonchi or rales  ?Abd: soft, nontender, no hepatomegaly  ?Ext: no edema ?Musculoskeletal:  No deformities, BUE and BLE strength normal and equal ?Skin: warm and dry  ?Neuro:  CNs 2-12 intact, no focal abnormalities noted ?Psych:  Normal affect  ? ?EKG:  The EKG was personally reviewed and demonstrates:  sinus tachy with CHB ?Telemetry:  Telemetry was personally reviewed and demonstrates:  ST with variable AV block ? ?Relevant CV Studies: ?See 2D echo ? ?Laboratory Data: ? ?High Sensitivity Troponin:   ?Recent Labs  ?Lab 02/22/22 ?2831 02/22/22 ?1045 02/22/22 ?1709  ?TROPONINIHS 27* 29* 35*  ?   ?Chemistry ?Recent Labs  ?Lab 02/22/22 ?0845 02/22/22 ?1328 02/23/22 ?5176  ?NA 131*  --  135  ?K 5.0  --  4.5  ?CL 101  --  103  ?CO2 13*  --  18*  ?GLUCOSE 270*  --  176*  ?BUN 35*  --  27*  ?CREATININE 1.33* 1.31* 1.18*  ?CALCIUM 9.9  --  9.4  ?MG  --  1.7  --   ?GFRNONAA 40* 41* 46*  ?ANIONGAP 17*  --  14  ?  ?Recent Labs  ?Lab 02/22/22 ?1045 02/23/22 ?1607  ?PROT 6.7 6.4*  ?ALBUMIN 3.6 3.3*  ?AST 19 15  ?ALT 14 14  ?ALKPHOS 31* 26*  ?BILITOT 1.2 1.4*  ? ?Lipids  ?Recent Labs  ?Lab 02/23/22 ?3710  ?CHOL 156  ?TRIG 67  ?HDL 84   ?[place] 59  ?CHOLHDL 1.9  ?  ?Hematology ?Recent Labs  ?Lab 02/22/22 ?6269 02/22/22 ?1328  02/23/22 ?7628  ?WBC 13.2* 10.0 8.6  ?RBC 4.76 4.85 4.49  ?HGB 13.7 13.8 12.8  ?HCT 41.4 41.9 38.6  ?MCV 87.0 86.4 86.0  ?MCH 28.8 28.5 28.5  ?MCHC 33.1 32.9 33.2  ?RDW 13.6 13.6 13.8  ?PLT 276 255 254  ? ?Thyroid  ?Recent Labs  ?Lab 02/22/22 ?1042  ?TSH 6.910*  ?FREET4 0.87  ?  ?BNP ?Recent Labs  ?Lab 02/22/22 ?0845  ?BNP 978.0*  ?  ?DDimer No results for input(s): DDIMER in the last 168 hours. ? ? ?Radiology/Studies:  ?DG Chest 2 View ? ?Result Date: 02/22/2022 ?CLINICAL DATA:  Dyspnea on exertion. EXAM: CHEST - 2 VIEW COMPARISON:  None. FINDINGS: Lungs are mildly hyperexpanded. Interstitial markings are diffusely coarsened with chronic features. Left base atelectasis/infiltrate noted. Streaky opacity in the parahilar right lung suggests atelectasis or scarring possible tiny pleural effusions. Bones are demineralized. Telemetry leads overlie the chest. IMPRESSION: 1. Left base atelectasis/infiltrate. 2. Streaky opacity in the parahilar right lung suggests atelectasis or scarring. 3. Interstitial prominence diffusely in both lungs may be chronic but component of interstitial edema not excluded. Electronically Signed   By: Misty Stanley M.D.   On: 02/22/2022 11:09  ? ?ECHOCARDIOGRAM COMPLETE ? ?Result Date: 02/22/2022 ?   ECHOCARDIOGRAM REPORT   Patient Name:   Sheri Moreno Date of Exam: 02/22/2022 Medical Rec #:  [id number]         Height:       66.0 in Accession #:    [id number]        Weight:       185.0 lb Date of Birth:  [birth year]          BSA:          1.935 m? Patient Age:    52 years          BP:           163/55 mmHg Patient Gender: F                 HR:           46 bpm. Exam Location:  Inpatient Procedure: 2D Echo, Cardiac Doppler and Color Doppler Indications:    CHF-Acute Systolic W54.62  History:        Patient has no prior history of Echocardiogram examinations.                 Arrythmias:Bradycardia.   Sonographer:    Merrie Roof RDCS Referring Phys: [place]  1. Left ventricular ejection fraction, by estimation, is 60 to 65%. The left ventricle has normal function. The left ventricle has no regi

## 2022-02-23 NOTE — Progress Notes (Signed)
? ?Progress Note ? ?Patient Name: Sheri Moreno ?Date of Encounter: 02/23/2022 ? ?[facility] Cardiologist: None  ? ?Subjective  ? ?Did well overnight. Rhythm still stable. Echo with normal LV function ? ?Net - 910. ?Crt 1.18 ?On RA ? ? ?Inpatient Medications  ?  ?Scheduled Meds: ? amLODipine  10 mg Oral Daily  ? aspirin EC  81 mg Oral Daily  ? Chlorhexidine Gluconate Cloth  6 each Topical Daily  ? furosemide  40 mg Intravenous Daily  ? heparin  5,000 Units Subcutaneous Q8H  ? insulin aspart  0-5 Units Subcutaneous QHS  ? insulin aspart  0-9 Units Subcutaneous TID WC  ? simvastatin  20 mg Oral Daily  ? sodium chloride flush  3 mL Intravenous Q12H  ? ?Continuous Infusions: ? sodium chloride    ? ?PRN Meds: ?sodium chloride, acetaminophen, ondansetron (ZOFRAN) IV, sodium chloride flush  ? ?Vital Signs  ?  ?Vitals:  ? 02/23/22 0800 02/23/22 0815 02/23/22 0830 02/23/22 0845  ?BP: (!) 145/44 (!) 139/46 (!) 130/43 (!) 142/46  ?Pulse: (!) 51 (!) 50 (!) 50 (!) 48  ?Resp: 19 (!) 23 19 (!) 21  ?Temp:      ?TempSrc:      ?SpO2: 90% 93% 91% 94%  ?Weight:      ?Height:      ? ? ?Intake/Output Summary (Last 24 hours) at 02/23/2022 0901 ?Last data filed at 02/23/2022 0800 ?Gross per 24 hour  ?Intake 240 ml  ?Output 1200 ml  ?Net -960 ml  ? ?Last 3 Weights 02/23/2022 02/23/2022 02/22/2022  ?Weight (lbs) 181 lb 3.5 oz 180 lb 12.4 oz 191 lb 9.3 oz  ?Weight (kg) 82.2 kg 82 kg 86.9 kg  ?   ? ?Telemetry  ?  ?CHB - Personally Reviewed ? ?ECG  ?  ?CHB - Personally Reviewed ? ?Physical Exam  ? ?Vitals:  ? 02/23/22 0830 02/23/22 0845  ?BP: (!) 130/43 (!) 142/46  ?Pulse: (!) 50 (!) 48  ?Resp: 19 (!) 21  ?Temp:    ?SpO2: 91% 94%  ? ? ?GEN: No acute distress.   ?Neck: +JVD ?Cardiac: bradycardic, no murmurs, rubs, or gallops.  ?Respiratory: Clear to auscultation bilaterally. ?GI: Soft, nontender, non-distended  ?MS: No edema; No deformity. ?Neuro:  Nonfocal  ?Psych: Normal affect  ? ?Labs  ?  ?High Sensitivity Troponin:   ?Recent Labs  ?Lab  02/22/22 ?2202 02/22/22 ?1045 02/22/22 ?1709  ?TROPONINIHS 27* 29* 35*  ?   ?Chemistry ?Recent Labs  ?Lab 02/22/22 ?5427 02/22/22 ?1045 02/22/22 ?1328 02/23/22 ?0623  ?NA 131*  --   --  135  ?K 5.0  --   --  4.5  ?CL 101  --   --  103  ?CO2 13*  --   --  18*  ?GLUCOSE 270*  --   --  176*  ?BUN 35*  --   --  27*  ?CREATININE 1.33*  --  1.31* 1.18*  ?CALCIUM 9.9  --   --  9.4  ?MG  --   --  1.7  --   ?PROT  --  6.7  --  6.4*  ?ALBUMIN  --  3.6  --  3.3*  ?AST  --  19  --  15  ?ALT  --  14  --  14  ?ALKPHOS  --  31*  --  26*  ?BILITOT  --  1.2  --  1.4*  ?GFRNONAA 40*  --  41* 46*  ?ANIONGAP 17*  --   --  14  ?  ?Lipids  ?Recent Labs  ?Lab 02/23/22 ?1761  ?CHOL 156  ?TRIG 67  ?HDL 84  ?[place] 59  ?CHOLHDL 1.9  ?  ?Hematology ?Recent Labs  ?Lab 02/22/22 ?0845 02/22/22 ?1328 02/23/22 ?6073  ?WBC 13.2* 10.0 8.6  ?RBC 4.76 4.85 4.49  ?HGB 13.7 13.8 12.8  ?HCT 41.4 41.9 38.6  ?MCV 87.0 86.4 86.0  ?MCH 28.8 28.5 28.5  ?MCHC 33.1 32.9 33.2  ?RDW 13.6 13.6 13.8  ?PLT 276 255 254  ? ?Thyroid  ?Recent Labs  ?Lab 02/22/22 ?1042  ?TSH 6.910*  ?FREET4 0.87  ?  ?BNP ?Recent Labs  ?Lab 02/22/22 ?0845  ?BNP 978.0*  ?  ?DDimer No results for input(s): DDIMER in the last 168 hours.  ? ?Radiology  ?  ?DG Chest 2 View ? ?Result Date: 02/22/2022 ?CLINICAL DATA:  Dyspnea on exertion. EXAM: CHEST - 2 VIEW COMPARISON:  None. FINDINGS: Lungs are mildly hyperexpanded. Interstitial markings are diffusely coarsened with chronic features. Left base atelectasis/infiltrate noted. Streaky opacity in the parahilar right lung suggests atelectasis or scarring possible tiny pleural effusions. Bones are demineralized. Telemetry leads overlie the chest. IMPRESSION: 1. Left base atelectasis/infiltrate. 2. Streaky opacity in the parahilar right lung suggests atelectasis or scarring. 3. Interstitial prominence diffusely in both lungs may be chronic but component of interstitial edema not excluded. Electronically Signed   By: Misty Stanley M.D.   On:  02/22/2022 11:09  ? ?ECHOCARDIOGRAM COMPLETE ? ?Result Date: 02/22/2022 ?   ECHOCARDIOGRAM REPORT   Patient Name:   Sheri Moreno Date of Exam: 02/22/2022 Medical Rec #:  [id number]         Height:       66.0 in Accession #:    [id number]        Weight:       185.0 lb Date of Birth:  04/15/1941          BSA:          1.935 m? Patient Age:    81 years          BP:           163/55 mmHg Patient Gender: F                 HR:           46 bpm. Exam Location:  Inpatient Procedure: 2D Echo, Cardiac Doppler and Color Doppler Indications:    CHF-Acute Systolic X38.18  History:        Patient has no prior history of Echocardiogram examinations.                 Arrythmias:Bradycardia.  Sonographer:    Merrie Roof RDCS Referring Phys: [place]  1. Left ventricular ejection fraction, by estimation, is 60 to 65%. The left ventricle has normal function. The left ventricle has no regional wall motion abnormalities. Left ventricular diastolic parameters are indeterminate.  2. Right ventricular systolic function is normal. The right ventricular size is normal. There is mildly elevated pulmonary artery systolic pressure.  3. Left atrial size was moderately dilated.  4. MVA ~2.2 cc/m2. Mean PG 11 mmHg HR 46 bpm. The mitral valve is abnormal. Mild mitral valve regurgitation. Severe mitral annular calcification.  5. Aortic valve regurgitation is mild. Aortic valve sclerosis/calcification is present, without any evidence of aortic stenosis.  6. The inferior vena cava is normal in size with greater than 50% respiratory variability, suggesting right atrial pressure of 3 mmHg.  FINDINGS  Left Ventricle: Left ventricular ejection fraction, by estimation, is 60 to 65%. The left ventricle has normal function. The left ventricle has no regional wall motion abnormalities. The left ventricular internal cavity size was normal in size. There is  no left ventricular hypertrophy of the basal segment. Left ventricular diastolic  parameters are indeterminate. Right Ventricle: The right ventricular size is normal. No increase in right ventricular wall thickness. Right ventricular systolic function is normal. There is mildly elevated pulmonary artery systolic pressure. The tricuspid regurgitant velocity is 3.18  m/s, and with an assumed right atrial pressure of 3 mmHg, the estimated right ventricular systolic pressure is 75.4 mmHg. Left Atrium: Left atrial size was moderately dilated. Right Atrium: Right atrial size was normal in size. Pericardium: There is no evidence of pericardial effusion. Mitral Valve: MVA ~2.2 cc/m2. Mean PG 11 mmHg HR 46 bpm. The mitral valve is abnormal. Severe mitral annular calcification. Mild mitral valve regurgitation. MV peak gradient, 25.2 mmHg. The mean mitral valve gradient is 11.0 mmHg. Tricuspid Valve: Tricuspid valve regurgitation is mild. Aortic Valve: Aortic valve regurgitation is mild. Aortic valve sclerosis/calcification is present, without any evidence of aortic stenosis. Aortic valve mean gradient measures 19.0 mmHg. Aortic valve peak gradient measures 32.3 mmHg. Aortic valve area, by VTI measures 2.09 cm?. Pulmonic Valve: Pulmonic valve regurgitation is not visualized. Aorta: The aortic root and ascending aorta are structurally normal, with no evidence of dilitation. Venous: The inferior vena cava is normal in size with greater than 50% respiratory variability, suggesting right atrial pressure of 3 mmHg.  LEFT VENTRICLE PLAX 2D LVIDd:         4.80 cm LVIDs:         3.10 cm LV PW:         1.00 cm LV IVS:        1.00 cm LVOT diam:     1.90 cm LV SV:         114 LV SV Index:   59 LVOT Area:     2.84 cm?  RIGHT VENTRICLE RV S prime:     17.60 cm/s TAPSE (M-mode): 2.6 cm LEFT ATRIUM              Index        RIGHT ATRIUM           Index LA diam:        4.70 cm  2.43 cm/m?   RA Area:     16.90 cm? LA Vol (A2C):   135.0 ml 69.78 ml/m?  RA Volume:   48.70 ml  25.17 ml/m? LA Vol (A4C):   70.1 ml  36.23 ml/m?  LA Biplane Vol: 99.8 ml  51.59 ml/m?  AORTIC VALVE AV Area (Vmax):    2.16 cm? AV Area (Vmean):   1.82 cm? AV Area (VTI):     2.09 cm? AV Vmax:           284.00 cm/s AV Vmean:          206.000 cm/s AV VTI:

## 2022-02-24 ENCOUNTER — Other Ambulatory Visit (HOSPITAL_COMMUNITY): Payer: Self-pay

## 2022-02-24 ENCOUNTER — Encounter (HOSPITAL_COMMUNITY)
Admission: EM | Disposition: A | Discharge status: Home / Self Care | Payer: Self-pay | Source: Home / Self Care | Attending: Internal Medicine

## 2022-02-24 HISTORY — PX: PACEMAKER IMPLANT: EP1218

## 2022-02-24 LAB — CBC
HCT: 35.1 % — ABNORMAL LOW (ref 36.0–46.0)
Hemoglobin: 12 g/dL (ref 12.0–15.0)
MCH: 28.9 pg (ref 26.0–34.0)
MCHC: 34.2 g/dL (ref 30.0–36.0)
MCV: 84.6 fL (ref 80.0–100.0)
Platelets: 237 10*3/uL (ref 150–400)
RBC: 4.15 MIL/uL (ref 3.87–5.11)
RDW: 13.4 % (ref 11.5–15.5)
WBC: 8.5 10*3/uL (ref 4.0–10.5)
nRBC: 0 % (ref 0.0–0.2)

## 2022-02-24 LAB — BASIC METABOLIC PANEL
Anion gap: 10 (ref 5–15)
BUN: 29 mg/dL — ABNORMAL HIGH (ref 8–23)
CO2: 20 mmol/L — ABNORMAL LOW (ref 22–32)
Calcium: 8.7 mg/dL — ABNORMAL LOW (ref 8.9–10.3)
Chloride: 104 mmol/L (ref 98–111)
Creatinine, Ser: 1.18 mg/dL — ABNORMAL HIGH (ref 0.44–1.00)
GFR, Estimated: 46 mL/min — ABNORMAL LOW (ref >60)
Glucose, Bld: 160 mg/dL — ABNORMAL HIGH (ref 70–99)
Potassium: 4.3 mmol/L (ref 3.5–5.1)
Sodium: 134 mmol/L — ABNORMAL LOW (ref 135–145)

## 2022-02-24 LAB — SURGICAL PCR SCREEN
MRSA, PCR: NEGATIVE
Staphylococcus aureus: NEGATIVE

## 2022-02-24 LAB — GLUCOSE, CAPILLARY
Glucose-Capillary: 156 mg/dL — ABNORMAL HIGH (ref 70–99)
Glucose-Capillary: 261 mg/dL — ABNORMAL HIGH (ref 70–99)
Glucose-Capillary: 131 mg/dL — ABNORMAL HIGH (ref 70–99)
Glucose-Capillary: 184 mg/dL — ABNORMAL HIGH (ref 70–99)
Glucose-Capillary: 266 mg/dL — ABNORMAL HIGH (ref 70–99)

## 2022-02-24 SURGERY — PACEMAKER IMPLANT
Anesthesia: LOCAL

## 2022-02-24 MED ORDER — SODIUM CHLORIDE 0.9 % IV SOLN
INTRAVENOUS | Status: DC | PRN
  Administered 2022-02-24: 80 mg

## 2022-02-24 MED ORDER — MIDAZOLAM HCL 5 MG/5ML IJ SOLN
INTRAMUSCULAR | Status: AC
  Filled 2022-02-24: qty 5

## 2022-02-24 MED ORDER — SODIUM CHLORIDE 0.9 % IV SOLN
80.0000 mg | INTRAVENOUS | Status: AC
  Administered 2022-02-24: 80 mg
  Filled 2022-02-24: qty 2

## 2022-02-24 MED ORDER — SODIUM CHLORIDE 0.9 % IV SOLN
INTRAVENOUS | Status: AC
  Filled 2022-02-24: qty 2

## 2022-02-24 MED ORDER — FENTANYL CITRATE (PF) 100 MCG/2ML IJ SOLN
INTRAMUSCULAR | Status: DC | PRN
  Administered 2022-02-24 (×3): 12.5 ug via INTRAVENOUS

## 2022-02-24 MED ORDER — FENTANYL CITRATE (PF) 100 MCG/2ML IJ SOLN
INTRAMUSCULAR | Status: AC
  Filled 2022-02-24: qty 2

## 2022-02-24 MED ORDER — LIDOCAINE HCL 1 % IJ SOLN
INTRAMUSCULAR | Status: AC
  Filled 2022-02-24: qty 60

## 2022-02-24 MED ORDER — VANCOMYCIN HCL IN DEXTROSE 1-5 GM/200ML-% IV SOLN
1000.0000 mg | Freq: Once | INTRAVENOUS | Status: AC
  Administered 2022-02-25: 1000 mg via INTRAVENOUS
  Filled 2022-02-24: qty 200

## 2022-02-24 MED ORDER — LIDOCAINE HCL (PF) 1 % IJ SOLN
INTRAMUSCULAR | Status: DC | PRN
  Administered 2022-02-24: 60 mL

## 2022-02-24 MED ORDER — CHLORHEXIDINE GLUCONATE 4 % EX LIQD
60.0000 mL | Freq: Once | CUTANEOUS | Status: AC
Start: 2022-02-24 — End: 2022-02-24
  Administered 2022-02-24: 4 via TOPICAL
  Filled 2022-02-24: qty 60

## 2022-02-24 MED ORDER — SODIUM CHLORIDE 0.9 % IV SOLN
INTRAVENOUS | Status: DC
Start: 2022-02-24 — End: 2022-02-24

## 2022-02-24 MED ORDER — HEPARIN (PORCINE) IN NACL 1000-0.9 UT/500ML-% IV SOLN
INTRAVENOUS | Status: DC | PRN
  Administered 2022-02-24: 500 mL

## 2022-02-24 MED ORDER — VANCOMYCIN HCL IN DEXTROSE 1-5 GM/200ML-% IV SOLN
INTRAVENOUS | Status: AC
  Filled 2022-02-24: qty 200

## 2022-02-24 MED ORDER — CHLORHEXIDINE GLUCONATE 4 % EX LIQD
60.0000 mL | Freq: Once | CUTANEOUS | Status: AC
  Administered 2022-02-24: 4 via TOPICAL
  Filled 2022-02-24 (×5): qty 60

## 2022-02-24 MED ORDER — MIDAZOLAM HCL 5 MG/5ML IJ SOLN
INTRAMUSCULAR | Status: DC | PRN
  Administered 2022-02-24 (×3): 1 mg via INTRAVENOUS

## 2022-02-24 MED ORDER — SODIUM CHLORIDE 0.9 % IV SOLN: INTRAVENOUS | Status: DC

## 2022-02-24 MED ORDER — VANCOMYCIN HCL IN DEXTROSE 1-5 GM/200ML-% IV SOLN
1000.0000 mg | INTRAVENOUS | Status: AC
  Administered 2022-02-24: 1000 mg via INTRAVENOUS
  Filled 2022-02-24: qty 200

## 2022-02-24 SURGICAL SUPPLY — 13 items
CABLE SURGICAL S-101-97-12 (CABLE) ×2 IMPLANT
CATH RIGHTSITE C315HIS02 (CATHETERS) ×1 IMPLANT
IPG PACE AZUR XT DR MRI W1DR01 (Pacemaker) IMPLANT
LEAD CAPSURE NOVUS 5076-52CM (Lead) ×1 IMPLANT
LEAD SELECT SECURE 3830 383069 (Lead) IMPLANT
PACE AZURE XT DR MRI W1DR01 (Pacemaker) ×2 IMPLANT
PAD DEFIB RADIO PHYSIO CONN (PAD) ×2 IMPLANT
SELECT SECURE 3830 383069 (Lead) ×2 IMPLANT
SHEATH 7FR PRELUDE SNAP 13 (SHEATH) ×1 IMPLANT
SHEATH 9FR PRELUDE SNAP 13 (SHEATH) ×1 IMPLANT
SLITTER 6232ADJ (MISCELLANEOUS) ×1 IMPLANT
TRAY PACEMAKER INSERTION (PACKS) ×2 IMPLANT
WIRE HI TORQ VERSACORE-J 145CM (WIRE) ×1 IMPLANT

## 2022-02-24 NOTE — Progress Notes (Addendum)
? ?Electrophysiology Rounding Note ? ?Patient Name: Sheri Moreno ?Date of Encounter: 02/24/2022 ? ?Primary Cardiologist: None ?Electrophysiologist: New ? ? ?Subjective  ? ?NAEO. Questions re: PPM answered.  ? ?Inpatient Medications  ?  ?Scheduled Meds: ? amLODipine  10 mg Oral Daily  ? aspirin EC  81 mg Oral Daily  ? Chlorhexidine Gluconate Cloth  6 each Topical Daily  ? furosemide  40 mg Intravenous Daily  ? insulin aspart  0-5 Units Subcutaneous QHS  ? insulin aspart  0-9 Units Subcutaneous TID WC  ? simvastatin  20 mg Oral Daily  ? sodium chloride flush  3 mL Intravenous Q12H  ? spironolactone  12.5 mg Oral Daily  ? ?Continuous Infusions: ? sodium chloride    ? ?PRN Meds: ?sodium chloride, acetaminophen, ondansetron (ZOFRAN) IV, sodium chloride flush  ? ?Vital Signs  ?  ?Vitals:  ? 02/24/22 0300 02/24/22 0400 02/24/22 0500 02/24/22 0600  ?BP: 132/64 (!) 135/52 (!) 137/49 (!) 111/56  ?Pulse: (!) 48 (!) 49 (!) 58 81  ?Resp: '20 17 15 17  '$ ?Temp:  98 ?F (36.7 ?C)    ?TempSrc:  Axillary    ?SpO2: 95% 95% 96% 95%  ?Weight:   86.8 kg   ?Height:      ? ? ?Intake/Output Summary (Last 24 hours) at 02/24/2022 0702 ?Last data filed at 02/24/2022 0400 ?Gross per 24 hour  ?Intake 180 ml  ?Output 1500 ml  ?Net -1320 ml  ? ?Filed Weights  ? 02/23/22 0500 02/23/22 0730 02/24/22 0500  ?Weight: 82 kg 82.2 kg 86.8 kg  ? ? ?Physical Exam  ?  ?GEN- The patient is well appearing, alert and oriented x 3 today.   ?Head- normocephalic, atraumatic ?Eyes-  Sclera clear, conjunctiva pink ?Ears- hearing intact ?Oropharynx- clear ?Neck- supple ?Lungs- Clear to ausculation bilaterally, normal work of breathing ?Heart- Regular rate and rhythm, no murmurs, rubs or gallops ?GI- soft, NT, ND, + BS ?Extremities- no clubbing or cyanosis. No edema ?Skin- no rash or lesion ?Psych- euthymic mood, full affect ?Neuro- strength and sensation are intact ? ?Labs  ?  ?CBC ?Recent Labs  ?  02/23/22 ?5409 02/24/22 ?8119  ?WBC 8.6 8.5  ?HGB 12.8 12.0  ?HCT  38.6 35.1*  ?MCV 86.0 84.6  ?PLT 254 237  ? ?Basic Metabolic Panel ?Recent Labs  ?  02/22/22 ?1328 02/23/22 ?1478 02/24/22 ?2956  ?NA  --  135 134*  ?K  --  4.5 4.3  ?CL  --  103 104  ?CO2  --  18* 20*  ?GLUCOSE  --  176* 160*  ?BUN  --  27* 29*  ?CREATININE 1.31* 1.18* 1.18*  ?CALCIUM  --  9.4 8.7*  ?MG 1.7  --   --   ? ?Liver Function Tests ?Recent Labs  ?  02/22/22 ?1045 02/23/22 ?2130  ?AST 19 15  ?ALT 14 14  ?ALKPHOS 31* 26*  ?BILITOT 1.2 1.4*  ?PROT 6.7 6.4*  ?ALBUMIN 3.6 3.3*  ? ?No results for input(s): LIPASE, AMYLASE in the last 72 hours. ?Cardiac Enzymes ?No results for input(s): CKTOTAL, CKMB, CKMBINDEX, TROPONINI in the last 72 hours. ? ? ?Telemetry  ?  ?NSR 80s currently, overnight CHB in 40s (personally reviewed) ? ?Radiology  ?  ?DG Chest 2 View ? ?Result Date: 02/22/2022 ?CLINICAL DATA:  Dyspnea on exertion. EXAM: CHEST - 2 VIEW COMPARISON:  None. FINDINGS: Lungs are mildly hyperexpanded. Interstitial markings are diffusely coarsened with chronic features. Left base atelectasis/infiltrate noted. Streaky opacity in the parahilar  right lung suggests atelectasis or scarring possible tiny pleural effusions. Bones are demineralized. Telemetry leads overlie the chest. IMPRESSION: 1. Left base atelectasis/infiltrate. 2. Streaky opacity in the parahilar right lung suggests atelectasis or scarring. 3. Interstitial prominence diffusely in both lungs may be chronic but component of interstitial edema not excluded. Electronically Signed   By: Misty Stanley M.D.   On: 02/22/2022 11:09  ? ?ECHOCARDIOGRAM COMPLETE ? ?Result Date: 02/22/2022 ?   ECHOCARDIOGRAM REPORT   Patient Name:   Sheri Moreno Date of Exam: 02/22/2022 Medical Rec #:  [id number]         Height:       66.0 in Accession #:    [id number]        Weight:       185.0 lb Date of Birth:  1941-04-06          BSA:          1.935 m? Patient Age:    81 years          BP:           163/55 mmHg Patient Gender: F                 HR:           46 bpm. Exam  Location:  Inpatient Procedure: 2D Echo, Cardiac Doppler and Color Doppler Indications:    CHF-Acute Systolic E93.81  History:        Patient has no prior history of Echocardiogram examinations.                 Arrythmias:Bradycardia.  Sonographer:    Merrie Roof RDCS Referring Phys: [place]  1. Left ventricular ejection fraction, by estimation, is 60 to 65%. The left ventricle has normal function. The left ventricle has no regional wall motion abnormalities. Left ventricular diastolic parameters are indeterminate.  2. Right ventricular systolic function is normal. The right ventricular size is normal. There is mildly elevated pulmonary artery systolic pressure.  3. Left atrial size was moderately dilated.  4. MVA ~2.2 cc/m2. Mean PG 11 mmHg HR 46 bpm. The mitral valve is abnormal. Mild mitral valve regurgitation. Severe mitral annular calcification.  5. Aortic valve regurgitation is mild. Aortic valve sclerosis/calcification is present, without any evidence of aortic stenosis.  6. The inferior vena cava is normal in size with greater than 50% respiratory variability, suggesting right atrial pressure of 3 mmHg. FINDINGS  Left Ventricle: Left ventricular ejection fraction, by estimation, is 60 to 65%. The left ventricle has normal function. The left ventricle has no regional wall motion abnormalities. The left ventricular internal cavity size was normal in size. There is  no left ventricular hypertrophy of the basal segment. Left ventricular diastolic parameters are indeterminate. Right Ventricle: The right ventricular size is normal. No increase in right ventricular wall thickness. Right ventricular systolic function is normal. There is mildly elevated pulmonary artery systolic pressure. The tricuspid regurgitant velocity is 3.18  m/s, and with an assumed right atrial pressure of 3 mmHg, the estimated right ventricular systolic pressure is 01.7 mmHg. Left Atrium: Left atrial size was  moderately dilated. Right Atrium: Right atrial size was normal in size. Pericardium: There is no evidence of pericardial effusion. Mitral Valve: MVA ~2.2 cc/m2. Mean PG 11 mmHg HR 46 bpm. The mitral valve is abnormal. Severe mitral annular calcification. Mild mitral valve regurgitation. MV peak gradient, 25.2 mmHg. The mean mitral valve gradient is 11.0 mmHg. Tricuspid Valve: Tricuspid  valve regurgitation is mild. Aortic Valve: Aortic valve regurgitation is mild. Aortic valve sclerosis/calcification is present, without any evidence of aortic stenosis. Aortic valve mean gradient measures 19.0 mmHg. Aortic valve peak gradient measures 32.3 mmHg. Aortic valve area, by VTI measures 2.09 cm?. Pulmonic Valve: Pulmonic valve regurgitation is not visualized. Aorta: The aortic root and ascending aorta are structurally normal, with no evidence of dilitation. Venous: The inferior vena cava is normal in size with greater than 50% respiratory variability, suggesting right atrial pressure of 3 mmHg.  LEFT VENTRICLE PLAX 2D LVIDd:         4.80 cm LVIDs:         3.10 cm LV PW:         1.00 cm LV IVS:        1.00 cm LVOT diam:     1.90 cm LV SV:         114 LV SV Index:   59 LVOT Area:     2.84 cm?  RIGHT VENTRICLE RV S prime:     17.60 cm/s TAPSE (M-mode): 2.6 cm LEFT ATRIUM              Index        RIGHT ATRIUM           Index LA diam:        4.70 cm  2.43 cm/m?   RA Area:     16.90 cm? LA Vol (A2C):   135.0 ml 69.78 ml/m?  RA Volume:   48.70 ml  25.17 ml/m? LA Vol (A4C):   70.1 ml  36.23 ml/m? LA Biplane Vol: 99.8 ml  51.59 ml/m?  AORTIC VALVE AV Area (Vmax):    2.16 cm? AV Area (Vmean):   1.82 cm? AV Area (VTI):     2.09 cm? AV Vmax:           284.00 cm/s AV Vmean:          206.000 cm/s AV VTI:            0.544 m AV Peak Grad:      32.3 mmHg AV Mean Grad:      19.0 mmHg LVOT Vmax:         216.00 cm/s LVOT Vmean:        132.000 cm/s LVOT VTI:          0.401 m LVOT/AV VTI ratio: 0.74  AORTA Ao Root diam: 2.60 cm MITRAL VALVE               TRICUSPID VALVE MV Area VTI:  1.38 cm?    TV Peak grad:   36.0 mmHg MV Peak grad: 25.2 mmHg   TV Vmax:        3.00 m/s MV Mean grad: 11.0 mmHg   TR Peak grad:   40.4 mmHg MV Vmax:      2.51 m/s    TR Vmax:

## 2022-02-24 NOTE — Progress Notes (Signed)
RT set up CPAP and placed on patient. Pt tolerating settings well at this time. RT will continue to monitor. ?

## 2022-02-24 NOTE — Progress Notes (Signed)
? ?  Received a page from RN earlier tonight that patient and daughter did not understand the need for bed rest. I called and spoke with patient and explained Dr. Tanna Furry recommendation for bedrest until the morning due to risk for pacemaker lead dislodgment. Daughter voiced understanding of this but states patient is unable to use purewick. She states it does not work and every time patient urinates, it get all over her and they have to change the bed sheets. I spoke with Dr. Lovena Le and he said if absolutely unable to use purewick, okay to have RN help assist patient to bedside commode although he would prefer completed bedrest. Explained this to patient's daughter and she voiced understanding. ? ?Darreld Mclean, PA-C ?02/24/2022 8:42 PM ? ? ?

## 2022-02-24 NOTE — Progress Notes (Signed)
Placed patient on CPAP, auto settings via FFM (changed to small per pt request) with 2L O2 bleed in.  ?

## 2022-02-24 NOTE — TOC Benefit Eligibility Note (Signed)
Patient Advocate Encounter ? ?Insurance verification completed.   ? ?The patient is currently admitted and upon discharge could be taking Farxiga 10 mg. ? ?The current 30 day co-pay is, $95.00.  ? ?The patient is currently admitted and upon discharge could be taking Farxiga 10 mg. ? ?The current 30 day co-pay is, $45.00.  ? ?The patient is insured through [employer] Part D  ? ? ? ?Lyndel Safe, CPhT ?Pharmacy Patient Advocate Specialist ?[place] Patient Advocate Team ?Direct Number: [phone]  Fax: [phone] ? ? ? ? ? ?  ?

## 2022-02-24 NOTE — Progress Notes (Signed)
Hibiclean cleanse  ?

## 2022-02-25 ENCOUNTER — Inpatient Hospital Stay (HOSPITAL_COMMUNITY): Payer: Medicare HMO

## 2022-02-25 ENCOUNTER — Encounter (HOSPITAL_COMMUNITY): Payer: Self-pay | Admitting: Internal Medicine

## 2022-02-25 LAB — GLUCOSE, CAPILLARY: Glucose-Capillary: 199 mg/dL — ABNORMAL HIGH (ref 70–99)

## 2022-02-25 LAB — BASIC METABOLIC PANEL
Anion gap: 10 (ref 5–15)
BUN: 27 mg/dL — ABNORMAL HIGH (ref 8–23)
CO2: 23 mmol/L (ref 22–32)
Calcium: 8.4 mg/dL — ABNORMAL LOW (ref 8.9–10.3)
Chloride: 103 mmol/L (ref 98–111)
Creatinine, Ser: 1.05 mg/dL — ABNORMAL HIGH (ref 0.44–1.00)
GFR, Estimated: 53 mL/min — ABNORMAL LOW (ref >60)
Glucose, Bld: 203 mg/dL — ABNORMAL HIGH (ref 70–99)
Potassium: 4.2 mmol/L (ref 3.5–5.1)
Sodium: 136 mmol/L (ref 135–145)

## 2022-02-25 MED ORDER — ACETAMINOPHEN 325 MG PO TABS: 650.0000 mg | ORAL_TABLET | ORAL | Status: DC | PRN

## 2022-02-25 MED ORDER — METOPROLOL SUCCINATE ER 25 MG PO TB24: 25.0000 mg | ORAL_TABLET | Freq: Every day | ORAL | 6 refills | Status: DC

## 2022-02-25 MED ORDER — METOPROLOL SUCCINATE ER 25 MG PO TB24
25.0000 mg | ORAL_TABLET | Freq: Every day | ORAL | Status: DC
  Administered 2022-02-25: 25 mg via ORAL
  Filled 2022-02-25: qty 1

## 2022-02-25 MED ORDER — CARVEDILOL 6.25 MG PO TABS: 6.2500 mg | ORAL_TABLET | Freq: Two times a day (BID) | ORAL | Status: DC

## 2022-02-25 NOTE — Care Management Important Message (Signed)
Important Message ? ?Patient Details  ?Name: Sheri Moreno ?MRN: [id number] ?Date of Birth: 1941-01-13 ? ? ?Medicare Important Message Given:  Yes ? ? ? ? ?[name] ?02/25/2022, 8:24 AM ?

## 2022-02-25 NOTE — TOC Transition Note (Signed)
Transition of Care (TOC) - CM/SW Discharge Note ?Marvetta Gibbons Therapist, sports, BSN ?Transitions of Care ?Unit 4E- RN Case Manager ?See Treatment Team for direct phone #  ?6E cross coverage ? ?Patient Details  ?Name: Sheri Moreno ?MRN: [id number] ?Date of Birth: 07-26-1941 ? ?Transition of Care (TOC) CM/SW Contact:  ?Dahlia Client, Romeo Rabon, RN ?Phone Number: ?02/25/2022, 10:37 AM ? ? ?Clinical Narrative:    ?Pt stable for transition home today, Transition of Care Department [facility]) has reviewed patient and no TOC needs have been identified at this time.  ? ? ?Final next level of care: Home/Self Care ?Barriers to Discharge: No Barriers Identified ? ? ?Patient Goals and CMS Choice ?  ?  ?Choice offered to / list presented to : NA ? ?Discharge Placement ?  ?           ? Home ?  ?  ?  ? ?Discharge Plan and Services ?  ?  ?           ?DME Arranged: N/A ?DME Agency: NA ?  ?  ?  ?HH Arranged: NA ?[facility] Agency: NA ?  ?  ?  ? ?Social Determinants of Health (SDOH) Interventions ?  ? ? ?Readmission Risk Interventions ?Readmission Risk Prevention Plan 02/25/2022  ?Post Dischage Appt Complete  ?Medication Screening Complete  ?Transportation Screening Complete  ?Some recent data might be hidden  ? ? ? ? ? ?

## 2022-02-25 NOTE — Discharge Instructions (Signed)
After Your Pacemaker ? ? ?You have a Medtronic Pacemaker ? ?ACTIVITY ?Do not lift your arm above shoulder height for 1 week after your procedure. After 7 days, you may progress as below.  ?You should remove your sling 24 hours after your procedure, unless otherwise instructed by your provider.  ? ? ? Tuesday March 04, 2022  Wednesday March 05, 2022 Thursday March 06, 2022 Friday March 07, 2022  ? ?Do not lift, push, pull, or carry anything over 10 pounds with the affected arm until 6 weeks (Tuesday Apr 08, 2022 ) after your procedure.  ? ?You may drive AFTER your wound check, unless you have been told otherwise by your provider.  ? ?Ask your healthcare provider when you can go back to work ? ? ?INCISION/Dressing ?If you are on a blood thinner such as Coumadin, Xarelto, Eliquis, Plavix, or Pradaxa please confirm with your provider when this should be resumed.  ? ?If large square, outer bandage is left in place, this can be removed after 24 hours from your procedure. Do not remove steri-strips or glue as below.  ? ?Monitor your Pacemaker site for redness, swelling, and drainage. Call the device clinic at [phone] if you experience these symptoms or fever/chills. ? ?If your incision is sealed with Steri-strips or staples, you may shower 10 days after your procedure or when told by your provider. Do not remove the steri-strips or let the shower hit directly on your site. You may wash around your site with soap and water.   ? ?If you were discharged in a sling, please do not wear this during the day more than 48 hours after your surgery unless otherwise instructed. This may increase the risk of stiffness and soreness in your shoulder.  ? ?Avoid lotions, ointments, or perfumes over your incision until it is well-healed. ? ?You may use a hot tub or a pool AFTER your wound check appointment if the incision is completely closed. ? ?Pacemaker Alerts:  Some alerts are vibratory and others beep. These are NOT emergencies.  Please call our office to let us know. If this occurs at night or on weekends, it can wait until the next business day. Send a remote transmission. ? ?If your device is capable of reading fluid status (for heart failure), you will be offered monthly monitoring to review this with you.  ? ?DEVICE MANAGEMENT ?Remote monitoring is used to monitor your pacemaker from home. This monitoring is scheduled every 91 days by our office. It allows [place] to keep an eye on the functioning of your device to ensure it is working properly. You will routinely see your Electrophysiologist annually (more often if necessary).  ? ?You should receive your ID card for your new device in 4-8 weeks. Keep this card with you at all times once received. Consider wearing a medical alert bracelet or necklace. ? ?Your Pacemaker may be MRI compatible. This will be discussed at your next office visit/wound check.  You should avoid contact with strong electric or magnetic fields.  ? ?Do not use amateur (ham) radio equipment or electric (arc) welding torches. MP3 player headphones with magnets should not be used. Some devices are safe to use if held at least 12 inches (30 cm) from your Pacemaker. These include power tools, lawn mowers, and speakers. If you are unsure if something is safe to use, ask your health care provider. ? ?When using your cell phone, hold it to the ear that is on the opposite side from the  Pacemaker. Do not leave your cell phone in a pocket over the Pacemaker. ? ?You may safely use electric blankets, heating pads, computers, and microwave ovens. ? ?Call the office right away if: ?You have chest pain. ?You feel more short of breath than you have felt before. ?You feel more light-headed than you have felt before. ?Your incision starts to open up. ? ?This information is not intended to replace advice given to you by your health care provider. Make sure you discuss any questions you have with your health care provider.  ?

## 2022-02-25 NOTE — Discharge Summary (Addendum)
? ? ? ?ELECTROPHYSIOLOGY PROCEDURE DISCHARGE SUMMARY  ? ? ?Patient ID: Sheri Moreno,  ?MRN: [id number], DOB/AGE: 1941/02/10 81 y.o. ? ?Admit date: 02/22/2022 ?Discharge date: 02/25/2022 ? ?Primary Care Physician: Donald Prose, MD  ?Primary Cardiologist: None  ?Electrophysiologist: Dr. Lovena Le ? ?Primary Discharge Diagnosis:  ?Symptomatic bradycardia status post pacemaker implantation this admission ? ?Secondary Discharge Diagnosis:  ?HTN ? ?Allergies  ?Allergen Reactions  ? Ace Inhibitors Cough  ? Clindamycin Hcl Rash  ? Ethyl Chloride Rash  ? Penicillins Hives and Rash  ? ? ? ?Procedures This Admission:  ?1.  Implantation of a Medtronic dual chamber PPM on 02/24/22 by Dr. Lovena Le. The patient received a Medtronic model number [id number] PPM with model number 5076 right atrial lead and 3830 right ventricular lead. There were no immediate post procedure complications. ?2.  CXR on 02/25/22 demonstrated no pneumothorax status post device implantation.  ? ?Brief HPI: ?Sheri Moreno is a 81 y.o. female was admitted for symptomatic bradycardia and electrophysiology team asked to see for ?consideration of PPM implantation.  Past medical history includes above.  The patient has had symptomatic bradycardia without reversible causes identified.  Risks, benefits, and alternatives to PPM implantation were reviewed with the patient who wished to proceed.  ? ?Hospital Course:  ?The patient was admitted and underwent implantation of a Medtronic dual chamber PPM with details as outlined above.  She was monitored on telemetry overnight which demonstrated NSR s/p pacer.  Left chest was without hematoma or ecchymosis.  The device was interrogated and found to be functioning normally.  CXR was obtained and demonstrated no pneumothorax status post device implantation.  Wound care, arm mobility, and restrictions were reviewed with the patient.  The patient was examined and considered stable for discharge to home.   ? ?Her medications were  adjusted for her HTN in the setting of stopping her BB on admission.  These were re-adjusted and simplified for discharge. Can adjust further as needed as outpatient.  ? ?Physical Exam: ?Vitals:  ? 02/24/22 1802 02/24/22 1948 02/24/22 2155 02/25/22 0536  ?BP: (!) 152/64   128/69  ?Pulse: 88  99 95  ?Resp: '20 18 16 17  '$ ?Temp: 97.7 ?F (36.5 ?C) 97.7 ?F (36.5 ?C)  98.9 ?F (37.2 ?C)  ?TempSrc: Oral Oral  Oral  ?SpO2:    95%  ?Weight:      ?Height:      ? ? ?GEN- The patient is well appearing, alert and oriented x 3 today.   ?HEENT: normocephalic, atraumatic; sclera clear, conjunctiva pink; hearing intact; oropharynx clear; neck supple, no JVP ?Lymph- no cervical lymphadenopathy ?Lungs- Clear to ausculation bilaterally, normal work of breathing.  No wheezes, rales, rhonchi ?Heart- Regular rate and rhythm, no murmurs, rubs or gallops, PMI not laterally displaced ?GI- soft, non-tender, non-distended, bowel sounds present, no hepatosplenomegaly ?Extremities- no clubbing, cyanosis, or edema; DP/PT/radial pulses 2+ bilaterally ?MS- no significant deformity or atrophy ?Skin- warm and dry, no rash or lesion, left chest without hematoma/ecchymosis ?Psych- euthymic mood, full affect ?Neuro- strength and sensation are intact ? ? ?Labs: ?  ?Lab Results  ?Component Value Date  ? WBC 8.5 02/24/2022  ? HGB 12.0 02/24/2022  ? HCT 35.1 (L) 02/24/2022  ? MCV 84.6 02/24/2022  ? PLT 237 02/24/2022  ?  ?Recent Labs  ?Lab 02/23/22 ?2703 02/24/22 ?5009 02/25/22 ?3818  ?NA 135   < > 136  ?K 4.5   < > 4.2  ?CL 103   < > 103  ?CO2 18*   < >  23  ?BUN 27*   < > 27*  ?CREATININE 1.18*   < > 1.05*  ?CALCIUM 9.4   < > 8.4*  ?PROT 6.4*  --   --   ?BILITOT 1.4*  --   --   ?ALKPHOS 26*  --   --   ?ALT 14  --   --   ?AST 15  --   --   ?GLUCOSE 176*   < > 203*  ? < > = values in this interval not displayed.  ? ? ?Discharge Medications:  ?Allergies as of 02/25/2022   ? ?   Reactions  ? Ace Inhibitors Cough  ? Clindamycin Hcl Rash  ? Ethyl Chloride Rash  ?  Penicillins Hives, Rash  ? ?  ? ?  ?Medication List  ?  ? ?STOP taking these medications   ? ?nebivolol 5 MG tablet ?Commonly known as: BYSTOLIC ?  ? ?  ? ?TAKE these medications   ? ?acetaminophen 325 MG tablet ?Commonly known as: TYLENOL ?Take 2 tablets (650 mg total) by mouth every 4 (four) hours as needed for headache or mild pain. ?  ?amLODipine 10 MG tablet ?Commonly known as: NORVASC ?Take 10 mg by mouth daily. ?  ?Calcium Carb-Cholecalciferol 600-10 MG-MCG Tabs ?Take 1 tablet by mouth daily. ?  ?glipiZIDE 10 MG tablet ?Commonly known as: GLUCOTROL ?Take 10 mg by mouth daily before breakfast. ?  ?metFORMIN 500 MG 24 hr tablet ?Commonly known as: GLUCOPHAGE-XR ?Take 1,000 mg by mouth daily with breakfast. ?  ?metoprolol succinate 25 MG 24 hr tablet ?Commonly known as: TOPROL-XL ?Take 1 tablet (25 mg total) by mouth daily. ?  ?OSTEO BI-FLEX ADV JOINT SHIELD PO ?Take 1 tablet by mouth daily. ?  ?simvastatin 20 MG tablet ?Commonly known as: ZOCOR ?Take 20 mg by mouth daily. ?  ? ?  ? ? ?Disposition:  ? ? Follow-up Information   ? ? [place] Follow up.   ?Why: on 3/30 at 1120 for post pacemaker wound check ?Contact information: ?[address] ?[place] [address] ?[phone] ? ?  ?  ? ?  ?  ? ?  ? ? ?Duration of Discharge Encounter: Greater than 30 minutes including physician time. ? ?Signed, ?Shirley Friar, PA-C  ?02/25/2022 ?8:22 AM ? ?EP Attending ? ?Patient seen and examined. Agree with the findings as documented above. The patient is doing well this morning. PM incision demonstrates no hematoma or bleeding. Lungs are clear and CV with a RRR. Tele demonstrates NSR. PPM interrogation under my direction demonstrates normal DDD PM function. Her CXR demonstrates stable lead position. She will be discharged home with usual PPM followup.  ? ?Salome Spotted ? ? ?

## 2022-02-27 ENCOUNTER — Telehealth: Payer: Self-pay | Admitting: Internal Medicine

## 2022-02-27 DIAGNOSIS — I5031 Acute diastolic (congestive) heart failure: Secondary | ICD-10-CM

## 2022-02-27 NOTE — Telephone Encounter (Signed)
Pt c/o swelling: STAT is pt has developed SOB within 24 hours ? ?If swelling, where is the swelling located? Feet  ? ?How much weight have you gained and in what time span? N/A ? ?Have you gained 3 pounds in a day or 5 pounds in a week? N/A ? ?Do you have a log of your daily weights (if so, list)? No  ? ?Are you currently taking a fluid pill? No ? ?Are you currently SOB? No ? ?Have you traveled recently? No   ? ?Recently got a pacemaker implant and was told if swelling occurred in the feet to call and report it. ?

## 2022-02-28 MED ORDER — FUROSEMIDE 20 MG PO TABS: 20.0000 mg | ORAL_TABLET | Freq: Every day | ORAL | 3 refills | Status: DC

## 2022-02-28 NOTE — Telephone Encounter (Signed)
Per Dr. Lovena Le, patient can start Lasix 20 mg by mouth daily and recheck BMET when she comes in for wound check.  ? ?Patient wanted to know if it was okay for her to take Lutein/Zeaxanthin 40/2 mg for her eyes. Will send back to Dr. Lovena Le and his nurse for advisement. ?

## 2022-03-02 NOTE — Telephone Encounter (Signed)
Ok to take eye meds.  ?

## 2022-03-03 NOTE — Telephone Encounter (Signed)
Called patient back to let her know that it is okay to take eye medication. Patient asked about her diabetic medications and stated that her blood sugars were up. Informed patient to call her PCP about her diabetic medications and her blood glucose readings. Patient verbalized understanding. ?

## 2022-03-05 ENCOUNTER — Telehealth: Payer: Self-pay

## 2022-03-05 NOTE — Telephone Encounter (Signed)
Spoke with the patient who reports that she has been keeping up with her daily weights. She has gained 4 lbs since yesterday. She denies any SOB. Shoe does report swelling in her legs. She is trying to elevate her legs when she can. She has been taking her Lasix 20 mg daily as prescribed. She will come in tomorrow to see DOD.  ?

## 2022-03-05 NOTE — Telephone Encounter (Signed)
Patient called in stating that she has gained 4 pounds in 24 hours and she was told when she got her device put in that if she gained weight to call the device clinic. I have let patient know a nurse will give her a call back  ?

## 2022-03-05 NOTE — Telephone Encounter (Signed)
Transmission received.

## 2022-03-06 ENCOUNTER — Ambulatory Visit (INDEPENDENT_AMBULATORY_CARE_PROVIDER_SITE_OTHER): Payer: Medicare HMO

## 2022-03-06 ENCOUNTER — Other Ambulatory Visit: Payer: Medicare HMO

## 2022-03-06 ENCOUNTER — Encounter: Payer: Self-pay | Admitting: Cardiovascular Disease

## 2022-03-06 ENCOUNTER — Ambulatory Visit: Payer: Medicare HMO | Admitting: Cardiovascular Disease

## 2022-03-06 ENCOUNTER — Ambulatory Visit: Payer: Medicare HMO

## 2022-03-06 VITALS — BP 144/66 | HR 82 | Ht 66.0 in | Wt 182.0 lb

## 2022-03-06 DIAGNOSIS — I5031 Acute diastolic (congestive) heart failure: Secondary | ICD-10-CM

## 2022-03-06 DIAGNOSIS — I5033 Acute on chronic diastolic (congestive) heart failure: Secondary | ICD-10-CM | POA: Diagnosis not present

## 2022-03-06 LAB — CUP PACEART INCLINIC DEVICE CHECK
Battery Remaining Longevity: 182 mo
Battery Voltage: 3.23 V
Brady Statistic AP VP Percent: 0.79 %
Brady Statistic AP VS Percent: 0.69 %
Brady Statistic AS VP Percent: 2.68 %
Brady Statistic AS VS Percent: 95.84 %
Brady Statistic RA Percent Paced: 1.57 %
Brady Statistic RV Percent Paced: 3.47 %
Date Time Interrogation Session: 20230330151902
Implantable Lead Implant Date: 20230320
Implantable Lead Implant Date: 20230320
Implantable Lead Location: 753859
Implantable Lead Location: 753860
Implantable Lead Model: 3830
Implantable Lead Model: 5076
Implantable Pulse Generator Implant Date: 20230320
Lead Channel Impedance Value: 380 Ohm
Lead Channel Impedance Value: 418 Ohm
Lead Channel Impedance Value: 532 Ohm
Lead Channel Impedance Value: 551 Ohm
Lead Channel Pacing Threshold Amplitude: 0.375 V
Lead Channel Pacing Threshold Pulse Width: 0.4 ms
Lead Channel Sensing Intrinsic Amplitude: 12 mV
Lead Channel Sensing Intrinsic Amplitude: 15.875 mV
Lead Channel Sensing Intrinsic Amplitude: 4.125 mV
Lead Channel Sensing Intrinsic Amplitude: 4.625 mV
Lead Channel Setting Pacing Amplitude: 3.5 V
Lead Channel Setting Pacing Amplitude: 3.5 V
Lead Channel Setting Pacing Pulse Width: 0.4 ms
Lead Channel Setting Sensing Sensitivity: 0.9 mV

## 2022-03-06 NOTE — Patient Instructions (Signed)
Medication Instructions:  ?For 3 days - increase furosemide (Lasix) to 40 mg daily, then go back to 20 mg daily ? ?*If you need a refill on your cardiac medications before your next appointment, please call your pharmacy* ? ? ?Lab Work: ?None ? ?If you have labs (blood work) drawn today and your tests are completely normal, you will receive your results only by: ?MyChart Message (if you have MyChart) OR ?A paper copy in the mail ?If you have any lab test that is abnormal or we need to change your treatment, we will call you to review the results. ? ? ?Testing/Procedures: ?none ? ? ?Follow-Up: ?At [facility], you and your health needs are our priority.  As part of our continuing mission to provide you with exceptional heart care, we have created designated Provider Care Teams.  These Care Teams include your primary Cardiologist (physician) and Advanced Practice Providers (APPs -  Physician Assistants and Nurse Practitioners) who all work together to provide you with the care you need, when you need it. ? ?We recommend signing up for the patient portal called "MyChart".  Sign up information is provided on this After Visit Summary.  MyChart is used to connect with patients for Virtual Visits (Telemedicine).  Patients are able to view lab/test results, encounter notes, upcoming appointments, etc.  Non-urgent messages can be sent to your provider as well.   ?To learn more about what you can do with MyChart, go to [email or web address].   ? ?Your next appointment:   ?3-4 week(s) ? ?The format for your next appointment:   ?In Person ? ?Provider:   ?Phineas Inches, MD ? ?Other Instructions ?  ?

## 2022-03-06 NOTE — Progress Notes (Signed)

## 2022-03-06 NOTE — Patient Instructions (Signed)

## 2022-03-06 NOTE — Progress Notes (Signed)
? ? ?Chief Complaint  ?Patient presents with  ? Follow-up  ?  LE edema  ? ?History of Present Illness: 81 yo female with history of HTN, DM, HLD, sleep apnea, complete heart block s/p pacemaker placement who is added onto my DOD schedule today for evaluation of dyspnea and LE edema. Her primary cardiologist is Dr. Phineas Inches. She was admitted to [facility] 02/22/22 with dyspnea and LE edema and was found to be in complete heart block. Echo 02/22/22 with LVEF=60-65%, normal RV function. Mild mitral stenosis and mild mitral regurgitation. Mild aortic regurgitation. A pacemaker was placed on 02/24/22. She was volume overloaded and was diuresed with IV Lasix during the admission. She was discharged on Lasix 20 mg daily. She called our office yesterday and reported a 5 lb weight gain over 24 hours. She comes in today and her weight went down by 3 lbs overnight. She is wearing compression stockings. Minimal LE edeam today. Feeling great overall. No dyspnea.   ? ?Primary Care Physician: Donald Prose, MD ?Primary Cardiologist: Dr. Phineas Inches ?EP: Dr. Lovena Le ? ? ?Past Medical History:  ?Diagnosis Date  ? Cataract   ? Hypertension   ? Obesity   ? ? ?Past Surgical History:  ?Procedure Laterality Date  ? CATARACT EXTRACTION    ? PACEMAKER IMPLANT N/A 02/24/2022  ? Procedure: PACEMAKER IMPLANT;  Surgeon: Evans Lance, MD;  Location: [place] [facility];  Service: Cardiovascular;  Laterality: N/A;  ? TONSILLECTOMY    ? ? ?Current Outpatient Medications  ?Medication Sig Dispense Refill  ? acetaminophen (TYLENOL) 325 MG tablet Take 2 tablets (650 mg total) by mouth every 4 (four) hours as needed for headache or mild pain.    ? amLODipine (NORVASC) 10 MG tablet Take 10 mg by mouth daily.    ? Calcium Carb-Cholecalciferol 600-10 MG-MCG TABS Take 1 tablet by mouth daily.    ? furosemide (LASIX) 20 MG tablet Take 1 tablet (20 mg total) by mouth daily. 90 tablet 3  ? glipiZIDE (GLUCOTROL) 10 MG tablet Take 10 mg by mouth daily before  breakfast.    ? metFORMIN (GLUCOPHAGE-XR) 500 MG 24 hr tablet Take 1,000 mg by mouth daily with breakfast.    ? metoprolol succinate (TOPROL-XL) 25 MG 24 hr tablet Take 1 tablet (25 mg total) by mouth daily. 30 tablet 6  ? Misc Natural Products (OSTEO BI-FLEX ADV JOINT SHIELD PO) Take 1 tablet by mouth daily.      ? simvastatin (ZOCOR) 20 MG tablet Take 20 mg by mouth daily.    ? ?No current facility-administered medications for this visit.  ? ? ?Allergies  ?Allergen Reactions  ? Ace Inhibitors Cough  ? Clindamycin Hcl Rash  ? Ethyl Chloride Rash  ? Penicillins Hives and Rash  ? ? ?Social History  ? ?Socioeconomic History  ? Marital status: Single  ?  Spouse name: Not on file  ? Number of children: Not on file  ? Years of education: Not on file  ? Highest education level: Not on file  ?Occupational History  ? Occupation: Ecologist  ?Tobacco Use  ? Smoking status: Never  ? Smokeless tobacco: Never  ?Substance and Sexual Activity  ? Alcohol use: Not Currently  ?  Comment: rarely  ? Drug use: Not on file  ? Sexual activity: Not on file  ?Other Topics Concern  ? Not on file  ?Social History Narrative  ? Not on file  ? ?Social Determinants of Health  ? ?Financial Resource Strain:  Not on file  ?Food Insecurity: Not on file  ?Transportation Needs: Not on file  ?Physical Activity: Not on file  ?Stress: Not on file  ?Social Connections: Not on file  ?Intimate Partner Violence: Not on file  ? ? ?Family History  ?Problem Relation Age of Onset  ? Pancreatic cancer Sister   ? Stomach cancer Father   ? Hypertension Other   ? Lung cancer Maternal Aunt   ? ? ?Review of Systems:  As stated in the HPI and otherwise negative.  ? ?BP (!) 144/66   Pulse 82   Ht '5\' 6"'$  (1.676 m)   Wt 182 lb (82.6 kg)   SpO2 98%   BMI 29.38 kg/m?  ? ?Physical Examination: ?General: Well developed, well nourished, NAD  ?HEENT: OP clear, mucus membranes moist  ?SKIN: warm, dry. No rashes. ?Neuro: No focal deficits  ?Musculoskeletal: Muscle  strength 5/5 all ext  ?Psychiatric: Mood and affect normal  ?Neck: No JVD, no carotid bruits, no thyromegaly, no lymphadenopathy.  ?Lungs:Clear bilaterally, no wheezes, rhonci, crackles ?Cardiovascular: Regular rate and rhythm. No murmurs, gallops or rubs. ?Abdomen:Soft. Bowel sounds present. Non-tender.  ?Extremities: No lower extremity edema. Pulses are 2 + in the bilateral DP/PT. ? ?EKG:  EKG is not ordered today. ?The ekg ordered today demonstrates  ? ?Echo 02/22/22: ? 1. Left ventricular ejection fraction, by estimation, is 60 to 65%. The  ?left ventricle has normal function. The left ventricle has no regional  ?wall motion abnormalities. Left ventricular diastolic parameters are  ?indeterminate.  ? 2. Right ventricular systolic function is normal. The right ventricular  ?size is normal. There is mildly elevated pulmonary artery systolic  ?pressure.  ? 3. Left atrial size was moderately dilated.  ? 4. MVA ~2.2 cc/m2. Mean PG 11 mmHg HR 46 bpm. The mitral valve is  ?abnormal. Mild mitral valve regurgitation. Severe mitral annular  ?calcification.  ? 5. Aortic valve regurgitation is mild. Aortic valve  ?sclerosis/calcification is present, without any evidence of aortic  ?stenosis.  ? 6. The inferior vena cava is normal in size with greater than 50%  ?respiratory variability, suggesting right atrial pressure of 3 mmHg. ? ?Recent Labs: ?02/22/2022: B Natriuretic Peptide 978.0; Magnesium 1.7; TSH 6.910 ?02/23/2022: ALT 14 ?02/24/2022: Hemoglobin 12.0; Platelets 237 ?02/25/2022: BUN 27; Creatinine, Ser 1.05; Potassium 4.2; Sodium 136  ? ?Lipid Panel ?   ?Component Value Date/Time  ? CHOL 156 02/23/2022 0551  ? TRIG 67 02/23/2022 0551  ? HDL 84 02/23/2022 0551  ? CHOLHDL 1.9 02/23/2022 0551  ? VLDL 13 02/23/2022 0551  ? [place] 59 02/23/2022 0551  ? ?  ?Wt Readings from Last 3 Encounters:  ?03/06/22 182 lb (82.6 kg)  ?02/24/22 191 lb 5.8 oz (86.8 kg)  ?01/11/13 204 lb 12.8 oz (92.9 kg)  ?  ? ?Assessment and Plan:  ? ?1.  Acute on chronic diastolic CHF: Weight is slightly above her new baseline at discharge. She has been taking Lasix 20 mg daily. I will have her take 40 mg of Lasix for the next 3 days and then go back to 20 mg daily. BMET today.  ? ?2. Complete heart block s/p pacemaker: Wound check today. Her surgical site is healing well.  ? ?Labs/ tests ordered today include:  ?No orders of the defined types were placed in this encounter. ? ? ? ?Disposition:   F/U with Dr. Harl Bowie or office APP in 2-3 weeks.  ? ? ?Signed, ?Lauree Chandler, MD ?03/06/2022 4:29 PM    ?  [facility] ?[address], [place], [place]  [address] ?Phone: [phone]; Fax: [phone]  ? ? ?

## 2022-03-07 LAB — BASIC METABOLIC PANEL
BUN/Creatinine Ratio: 20 (ref 12–28)
BUN: 21 mg/dL (ref 8–27)
CO2: 19 mmol/L — ABNORMAL LOW (ref 20–29)
Calcium: 9.7 mg/dL (ref 8.7–10.3)
Chloride: 100 mmol/L (ref 96–106)
Creatinine, Ser: 1.04 mg/dL — ABNORMAL HIGH (ref 0.57–1.00)
Glucose: 233 mg/dL — ABNORMAL HIGH (ref 70–99)
Potassium: 4.2 mmol/L (ref 3.5–5.2)
Sodium: 138 mmol/L (ref 134–144)
eGFR: 54 mL/min/{1.73_m2} — ABNORMAL LOW (ref >59)

## 2022-03-20 ENCOUNTER — Telehealth: Payer: Self-pay | Admitting: Internal Medicine

## 2022-03-20 NOTE — Telephone Encounter (Signed)
Dr. Angelena Form pt ?

## 2022-03-20 NOTE — Telephone Encounter (Signed)
Returned call to pt she states that her weight has been up and down lately and she has doubled up on her lasix for the last 2 days. She takes '20mg'$  daily but, Tuesday and Wed she has taken '40mg'$ . She states that when she becomes active during the day she becomes swollen and and her weight has increased. Her last creatinine on 03-06-22 was 1.'04mg'$ /dl this is about what it runs on the last few blood draws. Her wt has been ranging between 177.6 to 183.2. since the 03-11-22. She is not SOB. She will take BP/HR and continue to log her weight daily before upcoming appt, verbalized understanding ?

## 2022-03-20 NOTE — Telephone Encounter (Signed)
Pt c/o swelling: STAT is pt has developed SOB within 24 hours ? ?If swelling, where is the swelling located? Legs, left leg is more swollen than the right leg.  ? ?How much weight have you gained and in what time span? 3lbs in a day  ? ?Have you gained 3 pounds in a day or 5 pounds in a week? 3lbs in a day ? ?Do you have a log of your daily weights (if so, list)? 4/4 177.6 ?4/5 179.4 ?4/6 183.2 ?4/7 178.4 ?4/8 179.2 ?4/9 179.2 ?4/10 179.6 ?4/11 176.6 ?4/12 179.6 ? ?Are you currently taking a fluid pill? yes ? ?Are you currently SOB? no ? ?Have you traveled recently? No ? ? ?This is an on going issue, she has had swelling in the past she was told to double her lasix for a few days, then go back to her normal dosage which she did. The swelling never really went away.  ?

## 2022-03-20 NOTE — Telephone Encounter (Signed)
Dr. Angelena Form saw this patient as a DOD visit.  Her follow up is w Dr. Phineas Inches or office APP in 2-3 weeks.  Will remove from Dr. Camillia Herter basket. ?

## 2022-03-27 ENCOUNTER — Encounter: Payer: Self-pay | Admitting: Internal Medicine

## 2022-03-27 ENCOUNTER — Ambulatory Visit: Payer: Medicare HMO | Admitting: Internal Medicine

## 2022-03-27 VITALS — BP 124/58 | HR 68 | Ht 65.0 in | Wt 181.0 lb

## 2022-03-27 DIAGNOSIS — I5032 Chronic diastolic (congestive) heart failure: Secondary | ICD-10-CM | POA: Insufficient documentation

## 2022-03-27 DIAGNOSIS — I5033 Acute on chronic diastolic (congestive) heart failure: Secondary | ICD-10-CM

## 2022-03-27 NOTE — Patient Instructions (Addendum)
Medication Instructions:  ?No Changes In Medications at this time.  ?*If you need a refill on your cardiac medications before your next appointment, please call your pharmacy* ? ?Lab Work: ?None Ordered At This Time.  ?If you have labs (blood work) drawn today and your tests are completely normal, you will receive your results only by: ?MyChart Message (if you have MyChart) OR ?A paper copy in the mail ?If you have any lab test that is abnormal or we need to change your treatment, we will call you to review the results. ? ?Testing/Procedures: ?None Ordered At This Time.  ? ?Follow-Up: ?At [facility], you and your health needs are our priority.  As part of our continuing mission to provide you with exceptional heart care, we have created designated Provider Care Teams.  These Care Teams include your primary Cardiologist (physician) and Advanced Practice Providers (APPs -  Physician Assistants and Nurse Practitioners) who all work together to provide you with the care you need, when you need it. ? ?We recommend signing up for the patient portal called "MyChart".  Sign up information is provided on this After Visit Summary.  MyChart is used to connect with patients for Virtual Visits (Telemedicine).  Patients are able to view lab/test results, encounter notes, upcoming appointments, etc.  Non-urgent messages can be sent to your provider as well.   ?To learn more about what you can do with MyChart, go to [email or web address].   ? ?Your next appointment:   ?3 month(s) ? ?The format for your next appointment:   ?In Person ? ?Provider:   ?Janina Mayo, MD   ? ?Other Instructions ?Let us know if you are having worsening shortness of breath, cannot lie flat at night, or your legs get very swollen. You can start by increasing your lasix to 40 mg daily for 3 to 5 days. Decrease your salt intake to 2g per day. ? ?

## 2022-03-27 NOTE — Progress Notes (Signed)
?Cardiology Office Note:   ? ?Date:  03/27/2022  ? ?ID:  Sheri Moreno, DOB 1941-08-19, MRN [id number] ? ?PCP:  Donald Prose, MD ?  ?[facility] Providers ?Cardiologist:  Janina Mayo, MD    ? ?Referring MD: Donald Prose, MD  ? ?No chief complaint on file. ?Establish care ? ?History of Present Illness:   ?Sheri Moreno is a 81 y.o. female with a hx of HTN, DM2, HLD, OSA, obesity who is being seen 02/22/2022 for the evaluation of DOE, and edema increasing over last 2 weeks at least and CHB s/p DC PPM (Medtronic) ? ?For details see H&P 3/18 and DC summary 02/25/2022. 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 She had mild hfpef decompensation and was diuresed prior to PPM placement. ? ?She saw Dr. Angelena Form and noted 5 pound weight gain on 3/30.  Her weight went down however once she saw him. She was instructed to increase her lasix for 3 days and then go back to the 20 mg lasix. ? ?Today her blood pressure is well controlled. She has mild bruising near the PPM site. She denies PND or orthpnea. She has cut back on activities. She is walking No pitting edema.  L can be more swollen then the R. This is chronic, she dislocated the L ankle. No pitting. She's back on the lasix 20 mg daily. She denies chest pain or pressure.   ? ?Cardiology Studies: ?TTE 3/128/2023- EF normal, mild MR, mild AI, RA pressure 3 mmHg. ? ?Past Medical History:  ?Diagnosis Date  ? Cataract   ? Hypertension   ? Obesity   ? ? ?Past Surgical History:  ?Procedure Laterality Date  ? CATARACT EXTRACTION    ? PACEMAKER IMPLANT N/A 02/24/2022  ? Procedure: PACEMAKER IMPLANT;  Surgeon: Evans Lance, MD;  Location: [place] [facility];  Service: Cardiovascular;  Laterality: N/A;  ? TONSILLECTOMY    ? ? ?Current Medications: ?Current Meds  ?Medication Sig  ? acetaminophen (TYLENOL) 325 MG tablet Take 2 tablets (650 mg total) by mouth every 4 (four) hours as needed for  headache or mild pain.  ? amLODipine (NORVASC) 10 MG tablet Take 10 mg by mouth daily.  ? Calcium Carb-Cholecalciferol 600-10 MG-MCG TABS Take 1 tablet by mouth daily.  ? furosemide (LASIX) 20 MG tablet Take 1 tablet (20 mg total) by mouth daily.  ? glipiZIDE (GLUCOTROL) 10 MG tablet Take 10 mg by mouth daily before breakfast.  ? metFORMIN (GLUCOPHAGE-XR) 500 MG 24 hr tablet Take 1,000 mg by mouth daily with breakfast.  ? metoprolol succinate (TOPROL-XL) 25 MG 24 hr tablet Take 1 tablet (25 mg total) by mouth daily.  ? Misc Natural Products (OSTEO BI-FLEX ADV JOINT SHIELD PO) Take 1 tablet by mouth daily.    ? simvastatin (ZOCOR) 20 MG tablet Take 20 mg by mouth daily.  ?  ? ?Allergies:   Ace inhibitors, Clindamycin hcl, Ethyl chloride, and Penicillins  ? ?Social History  ? ?Socioeconomic History  ? Marital status: Single  ?  Spouse name: Not on file  ? Number of children: Not on file  ? Years of education: Not on file  ? Highest education level: Not on file  ?Occupational History  ? Occupation: Ecologist  ?Tobacco Use  ? Smoking status: Never  ? Smokeless tobacco: Never  ?Substance and Sexual Activity  ? Alcohol use: Not Currently  ?  Comment: rarely  ? Drug  use: Not on file  ? Sexual activity: Not on file  ?Other Topics Concern  ? Not on file  ?Social History Narrative  ? Not on file  ? ?Social Determinants of Health  ? ?Financial Resource Strain: Not on file  ?Food Insecurity: Not on file  ?Transportation Needs: Not on file  ?Physical Activity: Not on file  ?Stress: Not on file  ?Social Connections: Not on file  ?  ? ?Family History: ?The patient's family history includes Hypertension in an other family member; Lung cancer in her maternal aunt; Pancreatic cancer in her sister; Stomach cancer in her father. ? ?ROS:   ?Please see the history of present illness.    ? All other systems reviewed and are negative. ? ?EKGs/Labs/Other Studies Reviewed:   ? ?The following studies were reviewed  today: ? ? ? ? ?Recent Labs: ?02/22/2022: B Natriuretic Peptide 978.0; Magnesium 1.7; TSH 6.910 ?02/23/2022: ALT 14 ?02/24/2022: Hemoglobin 12.0; Platelets 237 ?03/06/2022: BUN 21; Creatinine, Ser 1.04; Potassium 4.2; Sodium 138  ?Recent Lipid Panel ?   ?Component Value Date/Time  ? CHOL 156 02/23/2022 0551  ? TRIG 67 02/23/2022 0551  ? HDL 84 02/23/2022 0551  ? CHOLHDL 1.9 02/23/2022 0551  ? VLDL 13 02/23/2022 0551  ? [place] 59 02/23/2022 0551  ? ? ? ?Risk Assessment/Calculations:   ?  ? ?    ? ?Physical Exam:   ? ?VS:  ? ?Vitals:  ? 03/27/22 0756  ?BP: (!) 124/58  ?Pulse: 68  ?SpO2: 100%  ? ? ? ?Wt Readings from Last 3 Encounters:  ?03/27/22 181 lb (82.1 kg)  ?03/06/22 182 lb (82.6 kg)  ?02/24/22 191 lb 5.8 oz (86.8 kg)  ?  ? ?GEN:  Well nourished, well developed in no acute distress. Essential tremor ?HEENT: Normal ?NECK: No JVD; No carotid bruits ?LYMPHATICS: No lymphadenopathy ?CARDIAC: RRR, no murmurs, rubs, gallops ?RESPIRATORY:  Clear to auscultation without rales, wheezing or rhonchi  ?ABDOMEN: Soft, non-tender, non-distended ?MUSCULOSKELETAL:  No edema; No deformity  ?SKIN: Warm and dry ?NEUROLOGIC:  Alert and oriented x 3 ?PSYCHIATRIC:  Normal affect  ? ?ASSESSMENT:   ? ?HFpEF: continue lasix 20 mg daily. She is euvolemic today, Her weight today is 181. No pitting edema.  ?- continue lasix 20 mg daily ? ?S/p DC Medtronic PPM: No issues. She is established with device clinic.and see's Dr. Lovena Le who implanted her device. ? ?HTN: Well controlled. continue metop 25 mg daily. Continue norvasc 10 mg daily ? ?HLD: continue simvastatin 20 mg daily ? ?PLAN:   ? ?In order of problems listed above: ? ?Follow up 3 months ? ?   ? ? ?Medication Adjustments/Labs and Tests Ordered: ?Current medicines are reviewed at length with the patient today.  Concerns regarding medicines are outlined above.  ?No orders of the defined types were placed in this encounter. ? ?No orders of the defined types were placed in this  encounter. ? ? ?Patient Instructions  ?Medication Instructions:  ?No Changes In Medications at this time.  ?*If you need a refill on your cardiac medications before your next appointment, please call your pharmacy* ? ?Lab Work: ?None Ordered At This Time.  ?If you have labs (blood work) drawn today and your tests are completely normal, you will receive your results only by: ?MyChart Message (if you have MyChart) OR ?A paper copy in the mail ?If you have any lab test that is abnormal or we need to change your treatment, we will call you to review the results. ? ?  Testing/Procedures: ?None Ordered At This Time.  ? ?Follow-Up: ?At [facility], you and your health needs are our priority.  As part of our continuing mission to provide you with exceptional heart care, we have created designated Provider Care Teams.  These Care Teams include your primary Cardiologist (physician) and Advanced Practice Providers (APPs -  Physician Assistants and Nurse Practitioners) who all work together to provide you with the care you need, when you need it. ? ?We recommend signing up for the patient portal called "MyChart".  Sign up information is provided on this After Visit Summary.  MyChart is used to connect with patients for Virtual Visits (Telemedicine).  Patients are able to view lab/test results, encounter notes, upcoming appointments, etc.  Non-urgent messages can be sent to your provider as well.   ?To learn more about what you can do with MyChart, go to [email or web address].   ? ?Your next appointment:   ?3 month(s) ? ?The format for your next appointment:   ?In Person ? ?Provider:   ?Janina Mayo, MD   ? ?Other Instructions ?Let us know if you are having worsening shortness of breath, cannot lie flat at night, or your legs get very swollen. You can start by increasing your lasix to 40 mg daily for 3 to 5 days. Decrease your salt intake to 2g per day. ?  ? ?Signed, ?Janina Mayo, MD  ?03/27/2022 8:22 AM    ?[place] ?

## 2022-04-01 ENCOUNTER — Telehealth: Payer: Self-pay | Admitting: Internal Medicine

## 2022-04-01 NOTE — Telephone Encounter (Signed)
Patient is requesting to switch from Dr. Harl Bowie to Dr. Sallyanne Kuster for general cardiology. Please advise. ?

## 2022-04-01 NOTE — Telephone Encounter (Signed)
No objections

## 2022-04-09 DIAGNOSIS — E119 Type 2 diabetes mellitus without complications: Secondary | ICD-10-CM | POA: Diagnosis not present

## 2022-04-09 DIAGNOSIS — Z961 Presence of intraocular lens: Secondary | ICD-10-CM | POA: Diagnosis not present

## 2022-04-09 DIAGNOSIS — H353122 Nonexudative age-related macular degeneration, left eye, intermediate dry stage: Secondary | ICD-10-CM | POA: Diagnosis not present

## 2022-04-23 ENCOUNTER — Ambulatory Visit (INDEPENDENT_AMBULATORY_CARE_PROVIDER_SITE_OTHER): Payer: Medicare HMO | Admitting: Ophthalmology

## 2022-04-23 DIAGNOSIS — H353132 Nonexudative age-related macular degeneration, bilateral, intermediate dry stage: Secondary | ICD-10-CM | POA: Diagnosis not present

## 2022-04-23 DIAGNOSIS — H353130 Nonexudative age-related macular degeneration, bilateral, stage unspecified: Secondary | ICD-10-CM

## 2022-04-23 DIAGNOSIS — Z9889 Other specified postprocedural states: Secondary | ICD-10-CM | POA: Diagnosis not present

## 2022-04-23 DIAGNOSIS — Z9989 Dependence on other enabling machines and devices: Secondary | ICD-10-CM

## 2022-04-23 DIAGNOSIS — H35722 Serous detachment of retinal pigment epithelium, left eye: Secondary | ICD-10-CM | POA: Diagnosis not present

## 2022-04-23 DIAGNOSIS — H35372 Puckering of macula, left eye: Secondary | ICD-10-CM | POA: Diagnosis not present

## 2022-04-23 DIAGNOSIS — G4733 Obstructive sleep apnea (adult) (pediatric): Secondary | ICD-10-CM | POA: Diagnosis not present

## 2022-04-23 NOTE — Assessment & Plan Note (Signed)
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 ?

## 2022-04-23 NOTE — Progress Notes (Signed)
04/23/2022     CHIEF COMPLAINT Patient presents for  Chief Complaint  Patient presents with   Macular Degeneration      HISTORY OF PRESENT ILLNESS: Sheri Moreno is a 81 y.o. female who presents to the clinic today for:   HPI   6 mos fu ou oct fp. Pt stated vision has been stable since last visit.  Pt had a pacemaker inserted March 22nd, 2023. Pt denies floaters and FOL.    Last edited by Silvestre Moment on 04/23/2022  7:52 AM.      Referring physician: Donald Prose, MD [place],  [place] [address]  HISTORICAL INFORMATION:   Selected notes from the MEDICAL RECORD NUMBER    Lab Results  Component Value Date   HGBA1C 7.0 (H) 02/22/2022     CURRENT MEDICATIONS: No current outpatient medications on file. (Ophthalmic Drugs)   No current facility-administered medications for this visit. (Ophthalmic Drugs)   Current Outpatient Medications (Other)  Medication Sig   acetaminophen (TYLENOL) 325 MG tablet Take 2 tablets (650 mg total) by mouth every 4 (four) hours as needed for headache or mild pain.   amLODipine (NORVASC) 10 MG tablet Take 10 mg by mouth daily.   Calcium Carb-Cholecalciferol 600-10 MG-MCG TABS Take 1 tablet by mouth daily.   furosemide (LASIX) 20 MG tablet Take 1 tablet (20 mg total) by mouth daily.   glipiZIDE (GLUCOTROL) 10 MG tablet Take 10 mg by mouth daily before breakfast.   metFORMIN (GLUCOPHAGE-XR) 500 MG 24 hr tablet Take 1,000 mg by mouth daily with breakfast.   metoprolol succinate (TOPROL-XL) 25 MG 24 hr tablet Take 1 tablet (25 mg total) by mouth daily.   Misc Natural Products (OSTEO BI-FLEX ADV JOINT SHIELD PO) Take 1 tablet by mouth daily.     simvastatin (ZOCOR) 20 MG tablet Take 20 mg by mouth daily.   No current facility-administered medications for this visit. (Other)      REVIEW OF SYSTEMS: ROS   Positive for: Cardiovascular Negative for: Constitutional, Gastrointestinal, Neurological, Skin,  Genitourinary, Musculoskeletal, HENT, Endocrine, Eyes, Respiratory, Psychiatric, Allergic/Imm, Heme/Lymph Last edited by Silvestre Moment on 04/23/2022  7:52 AM.       ALLERGIES Allergies  Allergen Reactions   Ace Inhibitors Cough   Clindamycin Hcl Rash   Ethyl Chloride Rash   Penicillins Hives and Rash    PAST MEDICAL HISTORY Past Medical History:  Diagnosis Date   Cataract    Hypertension    Obesity    Past Surgical History:  Procedure Laterality Date   CATARACT EXTRACTION     PACEMAKER IMPLANT N/A 02/24/2022   Procedure: PACEMAKER IMPLANT;  Surgeon: Evans Lance, MD;  Location: [facility];  Service: Cardiovascular;  Laterality: N/A;   TONSILLECTOMY      FAMILY HISTORY Family History  Problem Relation Age of Onset   Pancreatic cancer Sister    Stomach cancer Father    Hypertension Other    Lung cancer Maternal Aunt     SOCIAL HISTORY Social History   Tobacco Use   Smoking status: Never   Smokeless tobacco: Never  Substance Use Topics   Alcohol use: Not Currently    Comment: rarely         OPHTHALMIC EXAM:  Base Eye Exam     Visual Acuity (ETDRS)       Right Left   Dist cc 20/25 -2 20/50   Dist ph cc  NI  Correction: Glasses         Tonometry (Tonopen, 7:58 AM)       Right Left   Pressure 16 15         Pupils       Pupils APD   Right PERRL None   Left PERRL None         Visual Fields       Left Right    Full Full         Extraocular Movement       Right Left    Full Full         Neuro/Psych     Oriented x3: Yes   Mood/Affect: Normal         Dilation     Both eyes: 1.0% Mydriacyl, 2.5% Phenylephrine @ 7:58 AM           Slit Lamp and Fundus Exam     External Exam       Right Left   External Normal Normal         Slit Lamp Exam       Right Left   Lids/Lashes Normal Normal   Conjunctiva/Sclera White and quiet White and quiet   Cornea Clear Clear   Anterior Chamber Deep and quiet Deep  and quiet   Iris Round and reactive Round and reactive   Lens Centered posterior chamber intraocular lens Centered posterior chamber intraocular lens   Anterior Vitreous Normal Normal         Fundus Exam       Right Left   Posterior Vitreous Posterior vitreous detachment clear, avitric   Disc Normal Normal   C/D Ratio 0.35 0.35   Macula Retinal pigment epithelial mottling, hard drusen, Early age related macular degeneration Retinal pigment epithelial mottling, hard drusen, Early age related macular degeneration, Epiretinal membrane with dense condensation over the fovea.   Vessels no DR no DR   Periphery Normal Normal            IMAGING AND PROCEDURES  Imaging and Procedures for 05/23/22  OCT, Retina - OU - Both Eyes       Right Eye Quality was good. Scan locations included subfoveal. Central Foveal Thickness: 294. Progression has been stable. Findings include epiretinal membrane.   Left Eye Quality was good. Scan locations included subfoveal. Central Foveal Thickness: 290. Progression has been stable. Findings include epiretinal membrane.   Notes Large subfoveal drusenoid deposits noted some 6 months previous have completely resolved, but the only interval change has been the patient has treated what was found to have undiagnosed and untreated severe OSA per her self-report and has now been on CPAP therapy for only 2.5 months     Color Fundus Photography Optos - OU - Both Eyes       Right Eye Progression has no prior data. Disc findings include normal observations. Macula : drusen. Vessels : normal observations. Periphery : normal observations.   Left Eye Progression has no prior data. Macula : drusen, epiretinal membrane. Vessels : normal observations. Periphery : normal observations.   Notes Vitreous debris OU             ASSESSMENT/PLAN:  OSA on CPAP Concomitant use of CPAP but twitched May 2022 and November 2022 coincident with complete resolution  of large subfoveal pigment epithelial detachment left eye.  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 OSA on CPAP  G47.33    Z99.89     3. 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 Epiretinal membrane, left eye  H35.372     5. History of vitrectomy  Z98.890     6. Intermediate stage nonexudative age-related macular degeneration of both eyes  H35.3132       1.  OS with severe epiretinal membrane in vitreous and ILM condensation over the foveal region with intact photoreceptor outer elements signifying excellent prognosis for visual acuity improvement with vitrectomy membrane peel left eye in this vitrectomized eye.  2.  Intermediate ARMD, no sign of CNVM OU  3.  Right subfoveal PED resolved after institution of newly discovered sleep apnea with the use of CPAP  Ophthalmic Meds Ordered this visit:  No orders of the defined types were placed in this encounter.      Return ,, [facility], for Schedule vitrectomy membrane [phone], OS.  There are no Patient Instructions on file for this visit.   Explained the diagnoses, plan, and follow up with the patient and they expressed understanding.  Patient expressed understanding of the importance of proper follow up care.   Clent Demark Bobbye Reinitz M.D. Diseases & Surgery of the Retina and Vitreous Retina & Diabetic [place] 05/23/22     Abbreviations: M myopia (nearsighted); A astigmatism; H hyperopia (farsighted); P presbyopia; Mrx spectacle prescription;  CTL contact lenses; OD right eye; OS left eye; OU both eyes  XT exotropia; ET esotropia; PEK punctate epithelial keratitis; PEE punctate  epithelial erosions; DES dry eye syndrome; MGD meibomian gland dysfunction; ATs artificial tears; PFAT's preservative free artificial tears; [place] nuclear sclerotic cataract; PSC posterior subcapsular cataract; ERM epi-retinal membrane; PVD posterior vitreous detachment; RD retinal detachment; DM diabetes mellitus; DR diabetic retinopathy; NPDR non-proliferative diabetic retinopathy; PDR proliferative diabetic retinopathy; CSME clinically significant macular edema; DME diabetic macular edema; dbh dot blot hemorrhages; CWS cotton wool spot; POAG primary open angle glaucoma; C/D cup-to-disc ratio; HVF humphrey visual field; GVF goldmann visual field; OCT optical coherence tomography; IOP intraocular pressure; BRVO Branch retinal vein occlusion; CRVO central retinal vein occlusion; CRAO central retinal artery occlusion; BRAO branch retinal artery occlusion; RT retinal tear; SB scleral buckle; PPV pars plana vitrectomy; VH Vitreous hemorrhage; PRP panretinal laser photocoagulation; IVK intravitreal kenalog; VMT vitreomacular traction; MH Macular hole;  NVD neovascularization of the disc; NVE neovascularization elsewhere; AREDS age related eye disease study; ARMD age related macular degeneration; POAG primary open angle glaucoma; EBMD epithelial/anterior basement membrane dystrophy; ACIOL anterior chamber intraocular lens; IOL intraocular lens; PCIOL posterior chamber intraocular lens; Phaco/IOL phacoemulsification with intraocular lens placement; [place] photorefractive keratectomy; LASIK laser assisted in situ keratomileusis; HTN hypertension; DM diabetes mellitus; COPD chronic obstructive pulmonary disease

## 2022-04-23 NOTE — Assessment & Plan Note (Signed)
No sign of CNVM 

## 2022-04-23 NOTE — Assessment & Plan Note (Signed)
No current floaters, residual ERM has developed ?

## 2022-04-28 ENCOUNTER — Encounter (INDEPENDENT_AMBULATORY_CARE_PROVIDER_SITE_OTHER): Payer: Self-pay

## 2022-04-28 ENCOUNTER — Ambulatory Visit (INDEPENDENT_AMBULATORY_CARE_PROVIDER_SITE_OTHER): Payer: Medicare HMO

## 2022-04-28 MED ORDER — CIPROFLOXACIN HCL 0.3 % OP SOLN: OPHTHALMIC | 0 refills | Status: AC

## 2022-04-28 MED ORDER — PREDNISOLONE ACETATE 1 % OP SUSP: 1.0000 [drp] | Freq: Four times a day (QID) | OPHTHALMIC | 0 refills | Status: AC

## 2022-04-28 MED ORDER — OFLOXACIN 0.3 % OP SOLN: 1.0000 [drp] | OPHTHALMIC | 0 refills | Status: AC

## 2022-04-28 NOTE — Progress Notes (Signed)
04/28/2022     CHIEF COMPLAINT Patient presents for Pre-op Exam   HISTORY OF PRESENT ILLNESS: Sheri Moreno is a 81 y.o. female who presents to the clinic today for:   HPI   Pre Op OS sx 05/14/2022. Patient states vision is stable and unchanged since last visit. Denies any new floaters or FOL.  Last edited by Sheri Moreno on 04/28/2022  4:10 PM.        HISTORICAL INFORMATION:   Selected notes from the MEDICAL RECORD NUMBER    Lab Results  Component Value Date   HGBA1C 7.0 (H) 02/22/2022     CURRENT MEDICATIONS: No current outpatient medications on file. (Ophthalmic Drugs)   No current facility-administered medications for this visit. (Ophthalmic Drugs)   Current Outpatient Medications (Other)  Medication Sig   acetaminophen (TYLENOL) 325 MG tablet Take 2 tablets (650 mg total) by mouth every 4 (four) hours as needed for headache or mild pain.   amLODipine (NORVASC) 10 MG tablet Take 10 mg by mouth daily.   Calcium Carb-Cholecalciferol 600-10 MG-MCG TABS Take 1 tablet by mouth daily.   furosemide (LASIX) 20 MG tablet Take 1 tablet (20 mg total) by mouth daily.   glipiZIDE (GLUCOTROL) 10 MG tablet Take 10 mg by mouth daily before breakfast.   metFORMIN (GLUCOPHAGE-XR) 500 MG 24 hr tablet Take 1,000 mg by mouth daily with breakfast.   metoprolol succinate (TOPROL-XL) 25 MG 24 hr tablet Take 1 tablet (25 mg total) by mouth daily.   Misc Natural Products (OSTEO BI-FLEX ADV JOINT SHIELD PO) Take 1 tablet by mouth daily.     simvastatin (ZOCOR) 20 MG tablet Take 20 mg by mouth daily.   No current facility-administered medications for this visit. 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/24/2022   Procedure: PACEMAKER IMPLANT;  Surgeon: Evans Lance, MD;  Location: [place] [facility];  Service: Cardiovascular;  Laterality: N/A;   TONSILLECTOMY      FAMILY HISTORY Family History  Problem Relation Age of Onset   Pancreatic cancer Sister    Stomach cancer Father    Hypertension Other    Lung cancer Maternal Aunt     SOCIAL HISTORY Social History   Tobacco Use   Smoking status: Never   Smokeless tobacco: Never  Substance Use Topics   Alcohol use: Not Currently    Comment: rarely         OPHTHALMIC EXAM:  Base Eye Exam     Visual Acuity (ETDRS)       Right Left   Dist cc 20/25 -1    Dist ph cc  NI    Correction: Glasses         Tonometry     Tonopen         Pupils       Pupils APD   Right PERRL None   Left PERRL None         Visual Fields (Counting fingers)       Left Right    Full Full  Extraocular Movement       Right Left    Full Full         Neuro/Psych     Oriented x3: Yes   Mood/Affect: Normal         Dilation     Both eyes: No dilation @ 4:11 PM            IMAGING AND PROCEDURES  Imaging and Procedures for '@TODAY'$ @           ASSESSMENT/PLAN:  No diagnosis found.  Ophthalmic Meds Ordered this visit:  No orders of the defined types were placed in this encounter.       Pre-op completed. Operative consent obtained with pre-op eye drops reviewed with Sheri Moreno and sent via [facility] as needed. Post op instructions reviewed with patient and per patient all questions answered.  Sheri Moreno

## 2022-05-14 ENCOUNTER — Encounter (AMBULATORY_SURGERY_CENTER): Payer: Medicare HMO | Admitting: Ophthalmology

## 2022-05-14 DIAGNOSIS — H35372 Puckering of macula, left eye: Secondary | ICD-10-CM | POA: Diagnosis not present

## 2022-05-15 ENCOUNTER — Ambulatory Visit (INDEPENDENT_AMBULATORY_CARE_PROVIDER_SITE_OTHER): Payer: Medicare HMO | Admitting: Ophthalmology

## 2022-05-15 DIAGNOSIS — H35372 Puckering of macula, left eye: Secondary | ICD-10-CM

## 2022-05-15 NOTE — Assessment & Plan Note (Signed)
Postop day #1 looks great, clear avitric

## 2022-05-15 NOTE — Patient Instructions (Addendum)

## 2022-05-15 NOTE — Progress Notes (Signed)
05/15/2022     CHIEF COMPLAINT Patient presents for  Chief Complaint  Patient presents with   Post-op Follow-up      HISTORY OF PRESENT ILLNESS: Sheri Moreno is a 81 y.o. female who presents to the clinic today for:   HPI     Post-op Follow-up           Laterality: left eye   Discomfort: itching and tearing.  Negative for pain, foreign body sensation, discharge and floaters   Vision: is stable         Comments   1 day POST OP OS SX 05/14/22, vitrectomy membrane peel for epiretinal membrane. Pt stated vision is still early to tell. Pt reports vision is still fuzzy. Pt denies FOL and floaters.        Last edited by Hurman Horn, MD on 05/15/2022  9:42 AM.      Referring physician: Donald Prose, MD [place] [place],  [place] [address]  HISTORICAL INFORMATION:   Selected notes from the MEDICAL RECORD NUMBER    Lab Results  Component Value Date   HGBA1C 7.0 (H) 02/22/2022     CURRENT MEDICATIONS: Current Outpatient Medications (Ophthalmic Drugs)  Medication Sig   ofloxacin (OCUFLOX) 0.3 % ophthalmic solution Place 1 drop into the left eye every 4 (four) hours for 21 days.   No current facility-administered medications for this visit. (Ophthalmic Drugs)   Current Outpatient Medications (Other)  Medication Sig   acetaminophen (TYLENOL) 325 MG tablet Take 2 tablets (650 mg total) by mouth every 4 (four) hours as needed for headache or mild pain.   amLODipine (NORVASC) 10 MG tablet Take 10 mg by mouth daily.   Calcium Carb-Cholecalciferol 600-10 MG-MCG TABS Take 1 tablet by mouth daily.   furosemide (LASIX) 20 MG tablet Take 1 tablet (20 mg total) by mouth daily.   glipiZIDE (GLUCOTROL) 10 MG tablet Take 10 mg by mouth daily before breakfast.   metFORMIN (GLUCOPHAGE-XR) 500 MG 24 hr tablet Take 1,000 mg by mouth daily with breakfast.   metoprolol succinate (TOPROL-XL) 25 MG 24 hr tablet Take 1 tablet (25 mg total) by mouth daily.    Misc Natural Products (OSTEO BI-FLEX ADV JOINT SHIELD PO) Take 1 tablet by mouth daily.     simvastatin (ZOCOR) 20 MG tablet Take 20 mg by mouth daily.   No current facility-administered medications for this visit. (Other)      REVIEW OF SYSTEMS: ROS   Negative for: Constitutional, Gastrointestinal, Neurological, Skin, Genitourinary, Musculoskeletal, HENT, Endocrine, Cardiovascular, Eyes, Respiratory, Psychiatric, Allergic/Imm, Heme/Lymph Last edited by Silvestre Moment on 05/15/2022  9:23 AM.       ALLERGIES Allergies  Allergen Reactions   Ace Inhibitors Cough   Clindamycin Hcl Rash   Ethyl Chloride Rash   Penicillins Hives and Rash    PAST MEDICAL HISTORY Past Medical History:  Diagnosis Date   Cataract    Hypertension    Obesity    Past Surgical History:  Procedure Laterality Date   CATARACT EXTRACTION     PACEMAKER IMPLANT N/A 02/24/2022   Procedure: PACEMAKER IMPLANT;  Surgeon: Evans Lance, MD;  Location: [place] [facility];  Service: Cardiovascular;  Laterality: N/A;   TONSILLECTOMY      FAMILY HISTORY Family History  Problem Relation Age of Onset   Pancreatic cancer Sister    Stomach cancer Father    Hypertension Other    Lung cancer Maternal Aunt     SOCIAL  HISTORY Social History   Tobacco Use   Smoking status: Never   Smokeless tobacco: Never  Substance Use Topics   Alcohol use: Not Currently    Comment: rarely         OPHTHALMIC EXAM:  Base Eye Exam     Visual Acuity (ETDRS)       Right Left   Dist cc 20/25 -2 20/150 -1   Dist ph cc  NI    Correction: Glasses         Tonometry (Tonopen, 9:27 AM)       Right Left   Pressure 15 15         Pupils       Pupils APD   Right PERRL None   Left PERRL None         Visual Fields       Left Right    Full Full         Neuro/Psych     Oriented x3: Yes   Mood/Affect: Normal         Dilation     Left eye: 2.5% Phenylephrine, 1.0% Mydriacyl @ 9:27 AM            Slit Lamp and Fundus Exam     External Exam       Right Left   External Normal Normal         Slit Lamp Exam       Right Left   Lids/Lashes Normal Normal   Conjunctiva/Sclera White and quiet White and quiet   Cornea Clear Clear   Anterior Chamber Deep and quiet Deep and quiet, no cells   Iris Round and reactive Round and reactive   Lens Centered posterior chamber intraocular lens Centered posterior chamber intraocular lens   Anterior Vitreous Normal Normal         Fundus Exam       Right Left   Posterior Vitreous Posterior vitreous detachment clear, avitric   Disc Normal Normal   C/D Ratio 0.35 0.35   Macula Retinal pigment epithelial mottling, hard drusen, Early age related macular degeneration Retinal pigment epithelial mottling, hard drusen, Early age related macular degeneration, no topographic distortion remains   Vessels no DR no DR   Periphery Normal Normal            IMAGING AND PROCEDURES  Imaging and Procedures for 05/15/22           ASSESSMENT/PLAN:  Epiretinal membrane, left eye Postop day #1 looks great, clear avitric     ICD-10-CM   1. Epiretinal membrane, left eye  H35.372       1.  Postop day #1 looks great, clear avitric, will continue to monitor and observe.  Patient understands limit activity for 10 days.  Patient to resume topical medications left eye today 2 bottles each bottle 1 drop 4 times daily  2.  3.  Ophthalmic Meds Ordered this visit:  No orders of the defined types were placed in this encounter.      Return in about 1 week (around 05/22/2022) for dilate, OS, POST OP, OCT.  Patient Instructions  Ofloxacin  4 times daily to the operative eye  Prednisolone acetate 1 drop to the operative eye 4 times daily  Patient instructed not to refill the medications and use them for maximum of 3 weeks.  Patient instructed do not rub the eye.  Patient has the option to use the patch at night. No lifting and bending for 1  week. No water IN the eye for 10 days. Do not rub the eye. Wear shield at night for 1-3 days.  Continue your topical medications for a total of 3 weeks.  Do not refill your postoperative medications unless instructed.  Refrain from exercise or intentional activity which increases our heart rate above resting levels.  Normal walking to complete normal activities of your day are appropriate.  Driving:  Legally, you only need one good eye, of 20/40 or better to drive.  However, the practice does not recommend driving during first weeks after surgery, IF you are uncomfortable with your visual functioning or capabilities.   If you have known sleep apnea, wear your CPAP as you normally should.    Explained the diagnoses, plan, and follow up with the patient and they expressed understanding.  Patient expressed understanding of the importance of proper follow up care.   Clent Demark Penni Penado M.D. Diseases & Surgery of the Retina and Vitreous Retina & Diabetic [place] 05/15/22     Abbreviations: M myopia (nearsighted); A astigmatism; H hyperopia (farsighted); P presbyopia; Mrx spectacle prescription;  CTL contact lenses; OD right eye; OS left eye; OU both eyes  XT exotropia; ET esotropia; PEK punctate epithelial keratitis; PEE punctate epithelial erosions; DES dry eye syndrome; MGD meibomian gland dysfunction; ATs artificial tears; PFAT's preservative free artificial tears; [place] nuclear sclerotic cataract; PSC posterior subcapsular cataract; ERM epi-retinal membrane; PVD posterior vitreous detachment; RD retinal detachment; DM diabetes mellitus; DR diabetic retinopathy; NPDR non-proliferative diabetic retinopathy; PDR proliferative diabetic retinopathy; CSME clinically significant macular edema; DME diabetic macular edema; dbh dot blot hemorrhages; CWS cotton wool spot; POAG primary open angle glaucoma; C/D cup-to-disc ratio; HVF humphrey visual field; GVF goldmann visual field; OCT optical coherence  tomography; IOP intraocular pressure; BRVO Branch retinal vein occlusion; CRVO central retinal vein occlusion; CRAO central retinal artery occlusion; BRAO branch retinal artery occlusion; RT retinal tear; SB scleral buckle; PPV pars plana vitrectomy; VH Vitreous hemorrhage; PRP panretinal laser photocoagulation; IVK intravitreal kenalog; VMT vitreomacular traction; MH Macular hole;  NVD neovascularization of the disc; NVE neovascularization elsewhere; AREDS age related eye disease study; ARMD age related macular degeneration; POAG primary open angle glaucoma; EBMD epithelial/anterior basement membrane dystrophy; ACIOL anterior chamber intraocular lens; IOL intraocular lens; PCIOL posterior chamber intraocular lens; Phaco/IOL phacoemulsification with intraocular lens placement; [place] photorefractive keratectomy; LASIK laser assisted in situ keratomileusis; HTN hypertension; DM diabetes mellitus; COPD chronic obstructive pulmonary disease

## 2022-05-22 ENCOUNTER — Encounter (INDEPENDENT_AMBULATORY_CARE_PROVIDER_SITE_OTHER): Payer: Self-pay | Admitting: Ophthalmology

## 2022-05-22 ENCOUNTER — Ambulatory Visit (INDEPENDENT_AMBULATORY_CARE_PROVIDER_SITE_OTHER): Payer: Medicare HMO | Admitting: Ophthalmology

## 2022-05-22 DIAGNOSIS — H353132 Nonexudative age-related macular degeneration, bilateral, intermediate dry stage: Secondary | ICD-10-CM

## 2022-05-22 DIAGNOSIS — H35372 Puckering of macula, left eye: Secondary | ICD-10-CM

## 2022-05-22 NOTE — Assessment & Plan Note (Signed)
OU stable

## 2022-05-22 NOTE — Progress Notes (Signed)
05/22/2022     CHIEF COMPLAINT Patient presents for  Chief Complaint  Patient presents with   Post-op Follow-up      HISTORY OF PRESENT ILLNESS: Sheri Moreno is a 81 y.o. female who presents to the clinic today for:   HPI     Post-op Follow-up           Laterality: left eye   Discomfort: Negative for pain, itching, foreign body sensation, tearing, discharge, floaters and none   Vision: is stable         Comments   1 week for DILATE, OS, POST OP, OCT. SX 05/14/22 Pt stated vision has been stable since last visit. Pt stated she has not noticed much changes. Pt noticed a black spot in left eye and it would move but very small.        Last edited by Silvestre Moment on 05/22/2022  3:22 PM.      Referring physician: Donald Prose, MD [address] [place],  [place] [address]  HISTORICAL INFORMATION:   Selected notes from the MEDICAL RECORD NUMBER    Lab Results  Component Value Date   HGBA1C 7.0 (H) 02/22/2022     CURRENT MEDICATIONS: No current outpatient medications on file. (Ophthalmic Drugs)   No current facility-administered medications for this visit. (Ophthalmic Drugs)   Current Outpatient Medications (Other)  Medication Sig   acetaminophen (TYLENOL) 325 MG tablet Take 2 tablets (650 mg total) by mouth every 4 (four) hours as needed for headache or mild pain.   amLODipine (NORVASC) 10 MG tablet Take 10 mg by mouth daily.   Calcium Carb-Cholecalciferol 600-10 MG-MCG TABS Take 1 tablet by mouth daily.   furosemide (LASIX) 20 MG tablet Take 1 tablet (20 mg total) by mouth daily.   glipiZIDE (GLUCOTROL) 10 MG tablet Take 10 mg by mouth daily before breakfast.   metFORMIN (GLUCOPHAGE-XR) 500 MG 24 hr tablet Take 1,000 mg by mouth daily with breakfast.   metoprolol succinate (TOPROL-XL) 25 MG 24 hr tablet Take 1 tablet (25 mg total) by mouth daily.   Misc Natural Products (OSTEO BI-FLEX ADV JOINT SHIELD PO) Take 1 tablet by mouth daily.      simvastatin (ZOCOR) 20 MG tablet Take 20 mg by mouth daily.   No current facility-administered medications for this visit. (Other)      REVIEW OF SYSTEMS: ROS   Negative for: Constitutional, Gastrointestinal, Neurological, Skin, Genitourinary, Musculoskeletal, HENT, Endocrine, Cardiovascular, Eyes, Respiratory, Psychiatric, Allergic/Imm, Heme/Lymph Last edited by Silvestre Moment on 05/22/2022  3:22 PM.       ALLERGIES Allergies  Allergen Reactions   Ace Inhibitors Cough   Clindamycin Hcl Rash   Ethyl Chloride Rash   Penicillins Hives and Rash    PAST MEDICAL HISTORY Past Medical History:  Diagnosis Date   Cataract    Hypertension    Obesity    Past Surgical History:  Procedure Laterality Date   CATARACT EXTRACTION     PACEMAKER IMPLANT N/A 02/24/2022   Procedure: PACEMAKER IMPLANT;  Surgeon: Evans Lance, MD;  Location: [facility];  Service: Cardiovascular;  Laterality: N/A;   TONSILLECTOMY      FAMILY HISTORY Family History  Problem Relation Age of Onset   Pancreatic cancer Sister    Stomach cancer Father    Hypertension Other    Lung cancer Maternal Aunt     SOCIAL HISTORY Social History   Tobacco Use   Smoking status: Never   Smokeless  tobacco: Never  Substance Use Topics   Alcohol use: Not Currently    Comment: rarely         OPHTHALMIC EXAM:  Base Eye Exam     Visual Acuity (ETDRS)       Right Left   Dist cc 20/30 +2 20/80   Dist ph cc  NI    Correction: Glasses         Tonometry (Tonopen, 3:27 PM)       Right Left   Pressure 13 15         Pupils       Pupils APD   Right PERRL None   Left PERRL None         Visual Fields       Left Right     Full         Extraocular Movement       Right Left    Full Full         Neuro/Psych     Oriented x3: Yes   Mood/Affect: Normal         Dilation     Left eye: 2.5% Phenylephrine, 1.0% Mydriacyl @ 3:27 PM           Slit Lamp and Fundus Exam      External Exam       Right Left   External Normal Normal         Slit Lamp Exam       Right Left   Lids/Lashes Normal Normal   Conjunctiva/Sclera White and quiet White and quiet   Cornea Clear Clear   Anterior Chamber Deep and quiet Deep and quiet, no cells   Iris Round and reactive Round and reactive   Lens Centered posterior chamber intraocular lens Centered posterior chamber intraocular lens   Anterior Vitreous Normal Normal         Fundus Exam       Right Left   Posterior Vitreous Posterior vitreous detachment clear, avitric   Disc Normal Normal   C/D Ratio 0.35 0.35   Macula Retinal pigment epithelial mottling, hard drusen, Early age related macular degeneration Retinal pigment epithelial mottling, hard drusen, Early age related macular degeneration, no topographic distortion remains   Vessels no DR no DR   Periphery Normal Normal            IMAGING AND PROCEDURES  Imaging and Procedures for 05/22/22  OCT, Retina - OU - Both Eyes       Right Eye Quality was good. Scan locations included subfoveal. Central Foveal Thickness: 296. Progression has been stable. Findings include epiretinal membrane.   Left Eye Quality was good. Scan locations included subfoveal. Central Foveal Thickness: 326. Progression has been stable. Findings include epiretinal membrane.   Notes Large subfoveal drusenoid deposits noted some 6 months previous have completely resolved, but the only interval change has been the patient has treated what was found to have undiagnosed and untreated severe OSA per her self-report and has now been on CPAP therapy for only 2.5 months              ASSESSMENT/PLAN:  Intermediate stage nonexudative age-related macular degeneration of both eyes OU stable  Epiretinal membrane, left eye OS postop vitrectomy membrane peel 1 week, minor thickening remains.     ICD-10-CM   1. Epiretinal membrane, left eye  H35.372 OCT, Retina - OU - Both Eyes     2. Intermediate stage nonexudative age-related macular degeneration of both  eyes  H35.3132       1.  OS much improved macular features, post vitrectomy membrane peel, ongoing healing process.  Patient to use topical medications for only 2 more weeks left eye.  2.  3.  Ophthalmic Meds Ordered this visit:  No orders of the defined types were placed in this encounter.      Return in about 6 weeks (around 07/03/2022) for dilate, OS, OCT.  Patient Instructions  Ofloxacin  4 times daily to the operative eye  Prednisolone acetate 1 drop to the operative eye 4 times daily  Patient instructed not to refill the medications and use them for maximum of 3 weeks.  Patient instructed do not rub the eye.  Patient has the option to use the patch at night.    Explained the diagnoses, plan, and follow up with the patient and they expressed understanding.  Patient expressed understanding of the importance of proper follow up care.   Clent Demark Antonya Leeder M.D. Diseases & Surgery of the Retina and Vitreous Retina & Diabetic [place] 05/22/22     Abbreviations: M myopia (nearsighted); A astigmatism; H hyperopia (farsighted); P presbyopia; Mrx spectacle prescription;  CTL contact lenses; OD right eye; OS left eye; OU both eyes  XT exotropia; ET esotropia; PEK punctate epithelial keratitis; PEE punctate epithelial erosions; DES dry eye syndrome; MGD meibomian gland dysfunction; ATs artificial tears; PFAT's preservative free artificial tears; [place] nuclear sclerotic cataract; PSC posterior subcapsular cataract; ERM epi-retinal membrane; PVD posterior vitreous detachment; RD retinal detachment; DM diabetes mellitus; DR diabetic retinopathy; NPDR non-proliferative diabetic retinopathy; PDR proliferative diabetic retinopathy; CSME clinically significant macular edema; DME diabetic macular edema; dbh dot blot hemorrhages; CWS cotton wool spot; POAG primary open angle glaucoma; C/D cup-to-disc ratio; HVF humphrey  visual field; GVF goldmann visual field; OCT optical coherence tomography; IOP intraocular pressure; BRVO Branch retinal vein occlusion; CRVO central retinal vein occlusion; CRAO central retinal artery occlusion; BRAO branch retinal artery occlusion; RT retinal tear; SB scleral buckle; PPV pars plana vitrectomy; VH Vitreous hemorrhage; PRP panretinal laser photocoagulation; IVK intravitreal kenalog; VMT vitreomacular traction; MH Macular hole;  NVD neovascularization of the disc; NVE neovascularization elsewhere; AREDS age related eye disease study; ARMD age related macular degeneration; POAG primary open angle glaucoma; EBMD epithelial/anterior basement membrane dystrophy; ACIOL anterior chamber intraocular lens; IOL intraocular lens; PCIOL posterior chamber intraocular lens; Phaco/IOL phacoemulsification with intraocular lens placement; [place] photorefractive keratectomy; LASIK laser assisted in situ keratomileusis; HTN hypertension; DM diabetes mellitus; COPD chronic obstructive pulmonary disease

## 2022-05-22 NOTE — Patient Instructions (Signed)
Ofloxacin  4 times daily to the operative eye  Prednisolone acetate 1 drop to the operative eye 4 times daily  Patient instructed not to refill the medications and use them for maximum of 3 weeks.  Patient instructed do not rub the eye.  Patient has the option to use the patch at night. 

## 2022-05-22 NOTE — Assessment & Plan Note (Signed)
OS postop vitrectomy membrane peel 1 week, minor thickening remains.

## 2022-05-23 ENCOUNTER — Encounter (INDEPENDENT_AMBULATORY_CARE_PROVIDER_SITE_OTHER): Payer: Self-pay | Admitting: Ophthalmology

## 2022-06-02 ENCOUNTER — Ambulatory Visit: Payer: Medicare HMO | Admitting: Internal Medicine

## 2022-06-02 ENCOUNTER — Encounter: Payer: Self-pay | Admitting: Internal Medicine

## 2022-06-02 VITALS — BP 122/66 | HR 76 | Ht 65.0 in | Wt 181.2 lb

## 2022-06-02 DIAGNOSIS — I5031 Acute diastolic (congestive) heart failure: Secondary | ICD-10-CM

## 2022-06-02 DIAGNOSIS — I5032 Chronic diastolic (congestive) heart failure: Secondary | ICD-10-CM | POA: Diagnosis not present

## 2022-06-05 ENCOUNTER — Ambulatory Visit (INDEPENDENT_AMBULATORY_CARE_PROVIDER_SITE_OTHER): Payer: Medicare HMO

## 2022-06-05 DIAGNOSIS — I5032 Chronic diastolic (congestive) heart failure: Secondary | ICD-10-CM | POA: Diagnosis not present

## 2022-06-06 LAB — CUP PACEART REMOTE DEVICE CHECK
Battery Remaining Longevity: 169 mo
Battery Voltage: 3.21 V
Brady Statistic AP VP Percent: 3.64 %
Brady Statistic AP VS Percent: 0.44 %
Brady Statistic AS VP Percent: 0 %
Brady Statistic AS VS Percent: 95.92 %
Brady Statistic RA Percent Paced: 4.11 %
Brady Statistic RV Percent Paced: 3.64 %
Date Time Interrogation Session: 20230630124429
Implantable Lead Implant Date: 20230320
Implantable Lead Implant Date: 20230320
Implantable Lead Location: 753859
Implantable Lead Location: 753860
Implantable Lead Model: 3830
Implantable Lead Model: 5076
Implantable Pulse Generator Implant Date: 20230320
Lead Channel Impedance Value: 304 Ohm
Lead Channel Impedance Value: 437 Ohm
Lead Channel Impedance Value: 437 Ohm
Lead Channel Impedance Value: 513 Ohm
Lead Channel Pacing Threshold Amplitude: 0.375 V
Lead Channel Pacing Threshold Amplitude: 0.875 V
Lead Channel Pacing Threshold Pulse Width: 0.4 ms
Lead Channel Pacing Threshold Pulse Width: 0.4 ms
Lead Channel Sensing Intrinsic Amplitude: 14.375 mV
Lead Channel Sensing Intrinsic Amplitude: 14.375 mV
Lead Channel Sensing Intrinsic Amplitude: 5.75 mV
Lead Channel Sensing Intrinsic Amplitude: 5.75 mV
Lead Channel Setting Pacing Amplitude: 1.5 V
Lead Channel Setting Pacing Amplitude: 2.75 V
Lead Channel Setting Pacing Pulse Width: 0.4 ms
Lead Channel Setting Sensing Sensitivity: 0.9 mV

## 2022-06-18 ENCOUNTER — Ambulatory Visit: Payer: Medicare HMO | Admitting: Internal Medicine

## 2022-06-20 NOTE — Progress Notes (Signed)
Remote pacemaker transmission.   

## 2022-07-03 ENCOUNTER — Ambulatory Visit (INDEPENDENT_AMBULATORY_CARE_PROVIDER_SITE_OTHER): Payer: Medicare HMO | Admitting: Ophthalmology

## 2022-07-03 ENCOUNTER — Encounter (INDEPENDENT_AMBULATORY_CARE_PROVIDER_SITE_OTHER): Payer: Self-pay | Admitting: Ophthalmology

## 2022-07-03 DIAGNOSIS — H35372 Puckering of macula, left eye: Secondary | ICD-10-CM

## 2022-07-03 DIAGNOSIS — H353132 Nonexudative age-related macular degeneration, bilateral, intermediate dry stage: Secondary | ICD-10-CM

## 2022-07-03 DIAGNOSIS — H35722 Serous detachment of retinal pigment epithelium, left eye: Secondary | ICD-10-CM

## 2022-07-03 NOTE — Progress Notes (Signed)
07/03/2022     CHIEF COMPLAINT Patient presents for  Chief Complaint  Patient presents with   Post-op Follow-up   Macular Degeneration      HISTORY OF PRESENT ILLNESS: Sheri Moreno is a 81 y.o. female who presents to the clinic today for:   HPI   6 weeks dilate OS OCT POST OP SX 05/14/22, vitrectomy membrane peel Pt states her vision has been really stable  Pt denies any new floaters or FOL follow-up of macular degeneration  Last edited by Hurman Horn, MD on 07/03/2022  3:28 PM.      Referring physician: Donald Prose, MD [address] [place],  [place] [address]  HISTORICAL INFORMATION:   Selected notes from the MEDICAL RECORD NUMBER    Lab Results  Component Value Date   HGBA1C 7.0 (H) 02/22/2022     CURRENT MEDICATIONS: No current outpatient medications on file. (Ophthalmic Drugs)   No current facility-administered medications for this visit. (Ophthalmic Drugs)   Current Outpatient Medications (Other)  Medication Sig   amLODipine (NORVASC) 10 MG tablet Take 10 mg by mouth daily.   Calcium Carb-Cholecalciferol 600-10 MG-MCG TABS Take 1 tablet by mouth daily.   furosemide (LASIX) 20 MG tablet Take 1 tablet (20 mg total) by mouth daily.   glipiZIDE (GLUCOTROL) 10 MG tablet Take 10 mg by mouth daily before breakfast.   metFORMIN (GLUCOPHAGE-XR) 500 MG 24 hr tablet Take 1,000 mg by mouth daily with breakfast.   metoprolol succinate (TOPROL-XL) 25 MG 24 hr tablet Take 1 tablet (25 mg total) by mouth daily.   Misc Natural Products (OSTEO BI-FLEX ADV JOINT SHIELD PO) Take 1 tablet by mouth daily.     simvastatin (ZOCOR) 20 MG tablet Take 20 mg by mouth daily.   No current facility-administered medications for this visit. (Other)      REVIEW OF SYSTEMS: ROS   Negative for: Constitutional, Gastrointestinal, Neurological, Skin, Genitourinary, Musculoskeletal, HENT, Endocrine, Cardiovascular, Eyes, Respiratory, Psychiatric, Allergic/Imm,  Heme/Lymph Last edited by Orene Desanctis D, CMA on 07/03/2022  2:44 PM.       ALLERGIES Allergies  Allergen Reactions   Ace Inhibitors Cough   Clindamycin Hcl Rash   Ethyl Chloride Rash   Penicillins Hives and Rash    PAST MEDICAL HISTORY Past Medical History:  Diagnosis Date   Cataract    Hypertension    Obesity    Past Surgical History:  Procedure Laterality Date   CATARACT EXTRACTION     PACEMAKER IMPLANT N/A 02/24/2022   Procedure: PACEMAKER IMPLANT;  Surgeon: Evans Lance, MD;  Location: [facility];  Service: Cardiovascular;  Laterality: N/A;   TONSILLECTOMY      FAMILY HISTORY Family History  Problem Relation Age of Onset   Pancreatic cancer Sister    Stomach cancer Father    Hypertension Other    Lung cancer Maternal Aunt     SOCIAL HISTORY Social History   Tobacco Use   Smoking status: Never   Smokeless tobacco: Never  Substance Use Topics   Alcohol use: Not Currently    Comment: rarely         OPHTHALMIC EXAM:  Base Eye Exam     Visual Acuity (ETDRS)       Right Left   Dist [place] 20/30 20/60   Dist ph [place]  NI         Tonometry (Tonopen, 2:49 PM)       Right Left   Pressure  11 13         Neuro/Psych     Oriented x3: Yes   Mood/Affect: Normal         Dilation     Left eye: 2.5% Phenylephrine, 1.0% Mydriacyl @ 2:45 PM           Slit Lamp and Fundus Exam     External Exam       Right Left   External Normal Normal         Slit Lamp Exam       Right Left   Lids/Lashes Normal Normal   Conjunctiva/Sclera White and quiet White and quiet   Cornea Clear Clear   Anterior Chamber Deep and quiet Deep and quiet, no cells   Iris Round and reactive Round and reactive   Lens Centered posterior chamber intraocular lens Centered posterior chamber intraocular lens   Anterior Vitreous Normal Normal         Fundus Exam       Right Left   Posterior Vitreous  clear, avitric   Disc  Normal   C/D Ratio  0.35    Macula  Retinal pigment epithelial mottling, hard drusen, Early age related macular degeneration, no topographic distortion remains   Vessels  no DR   Periphery  Normal            IMAGING AND PROCEDURES  Imaging and Procedures for 07/03/22  OCT, Retina - OU - Both Eyes       Right Eye Quality was good. Scan locations included subfoveal. Central Foveal Thickness: 294. Progression has been stable. Findings include epiretinal membrane.   Left Eye Quality was good. Scan locations included subfoveal. Central Foveal Thickness: 317. Progression has been stable.   Notes Large subfoveal drusenoid deposits have diminished in each eye.  Large thick central foveal epiretinal membrane removed via vitrectomy membrane peel and now also improving acuity.  Does need reevaluation for spectacle correction follow-up with her general eye doctor             ASSESSMENT/PLAN:  Intermediate stage nonexudative age-related macular degeneration of both eyes OU no sign of CNVM,  OS reviewed large subfoveal drusenoid deposits present back in May and November 2022 which subsided prior to vitrectomy membrane peel left eye May 23 after patient instituted CPAP therapy for sleep apnea  Retinal pigment epithelial detachment, left Resolved OS     ICD-10-CM   1. Epiretinal membrane, left eye  H35.372 OCT, Retina - OU - Both Eyes    2. Intermediate stage nonexudative age-related macular degeneration of both eyes  H35.3132     3. Retinal pigment epithelial detachment, left  H35.722       1.  OS doing well visual acuity improving post vitrectomy membrane peel.  2.  3.  Ophthalmic Meds Ordered this visit:  No orders of the defined types were placed in this encounter.      Return in about 6 months (around 01/03/2023) for DILATE OU, OCT.  There are no Patient Instructions on file for this visit.   Explained the diagnoses, plan, and follow up with the patient and they expressed  understanding.  Patient expressed understanding of the importance of proper follow up care.   Clent Demark Lerone Onder M.D. Diseases & Surgery of the Retina and Vitreous Retina & Diabetic [place] 07/03/22     Abbreviations: M myopia (nearsighted); A astigmatism; H hyperopia (farsighted); P presbyopia; Mrx spectacle prescription;  CTL contact lenses; OD right eye; OS left eye;  OU both eyes  XT exotropia; ET esotropia; PEK punctate epithelial keratitis; PEE punctate epithelial erosions; DES dry eye syndrome; MGD meibomian gland dysfunction; ATs artificial tears; PFAT's preservative free artificial tears; [place] nuclear sclerotic cataract; PSC posterior subcapsular cataract; ERM epi-retinal membrane; PVD posterior vitreous detachment; RD retinal detachment; DM diabetes mellitus; DR diabetic retinopathy; NPDR non-proliferative diabetic retinopathy; PDR proliferative diabetic retinopathy; CSME clinically significant macular edema; DME diabetic macular edema; dbh dot blot hemorrhages; CWS cotton wool spot; POAG primary open angle glaucoma; C/D cup-to-disc ratio; HVF humphrey visual field; GVF goldmann visual field; OCT optical coherence tomography; IOP intraocular pressure; BRVO Branch retinal vein occlusion; CRVO central retinal vein occlusion; CRAO central retinal artery occlusion; BRAO branch retinal artery occlusion; RT retinal tear; SB scleral buckle; PPV pars plana vitrectomy; VH Vitreous hemorrhage; PRP panretinal laser photocoagulation; IVK intravitreal kenalog; VMT vitreomacular traction; MH Macular hole;  NVD neovascularization of the disc; NVE neovascularization elsewhere; AREDS age related eye disease study; ARMD age related macular degeneration; POAG primary open angle glaucoma; EBMD epithelial/anterior basement membrane dystrophy; ACIOL anterior chamber intraocular lens; IOL intraocular lens; PCIOL posterior chamber intraocular lens; Phaco/IOL phacoemulsification with intraocular lens placement; [place]  photorefractive keratectomy; LASIK laser assisted in situ keratomileusis; HTN hypertension; DM diabetes mellitus; COPD chronic obstructive pulmonary disease

## 2022-07-03 NOTE — Assessment & Plan Note (Signed)
Resolved OS

## 2022-07-03 NOTE — Assessment & Plan Note (Signed)
OU no sign of CNVM,  OS reviewed large subfoveal drusenoid deposits present back in May and November 2022 which subsided prior to vitrectomy membrane peel left eye May 23 after patient instituted CPAP therapy for sleep apnea

## 2022-07-09 ENCOUNTER — Ambulatory Visit: Payer: Medicare HMO | Admitting: Cardiovascular Disease

## 2022-07-09 ENCOUNTER — Encounter: Payer: Self-pay | Admitting: Cardiovascular Disease

## 2022-07-09 VITALS — BP 134/62 | HR 90 | Ht 65.0 in | Wt 182.0 lb

## 2022-07-09 DIAGNOSIS — I480 Paroxysmal atrial fibrillation: Secondary | ICD-10-CM | POA: Diagnosis not present

## 2022-07-09 DIAGNOSIS — I5032 Chronic diastolic (congestive) heart failure: Secondary | ICD-10-CM | POA: Diagnosis not present

## 2022-07-09 DIAGNOSIS — Z950 Presence of cardiac pacemaker: Secondary | ICD-10-CM

## 2022-07-09 DIAGNOSIS — E119 Type 2 diabetes mellitus without complications: Secondary | ICD-10-CM | POA: Diagnosis not present

## 2022-07-09 DIAGNOSIS — E7800 Pure hypercholesterolemia, unspecified: Secondary | ICD-10-CM

## 2022-07-09 DIAGNOSIS — I10 Essential (primary) hypertension: Secondary | ICD-10-CM

## 2022-07-09 DIAGNOSIS — I442 Atrioventricular block, complete: Secondary | ICD-10-CM

## 2022-07-09 MED ORDER — APIXABAN 5 MG PO TABS: 5.0000 mg | ORAL_TABLET | Freq: Two times a day (BID) | ORAL | 11 refills | Status: DC

## 2022-07-09 NOTE — Patient Instructions (Signed)
Medication Instructions:  START Eliquis 5 mg twice daily  *If you need a refill on your cardiac medications before your next appointment, please call your pharmacy*   Lab Work: None ordered If you have labs (blood work) drawn today and your tests are completely normal, you will receive your results only by: [place] (if you have MyChart) OR A paper copy in the mail If you have any lab test that is abnormal or we need to change your treatment, we will call you to review the results.   Testing/Procedures: None ordered   Follow-Up: At [facility], you and your health needs are our priority.  As part of our continuing mission to provide you with exceptional heart care, we have created designated Provider Care Teams.  These Care Teams include your primary Cardiologist (physician) and Advanced Practice Providers (APPs -  Physician Assistants and Nurse Practitioners) who all work together to provide you with the care you need, when you need it.  We recommend signing up for the patient portal called "MyChart".  Sign up information is provided on this After Visit Summary.  MyChart is used to connect with patients for Virtual Visits (Telemedicine).  Patients are able to view lab/test results, encounter notes, upcoming appointments, etc.  Non-urgent messages can be sent to your provider as well.   To learn more about what you can do with MyChart, go to [email or web address].    Your next appointment:   Follow up in December on a device day with Dr. Sallyanne Kuster  Important Information About Sugar

## 2022-07-09 NOTE — Progress Notes (Signed)
Cardiology Office Note:    Date:  07/13/2022   ID:  Sheri Moreno, DOB [birth year], MRN [id number]  PCP:  Donald Prose, MD   [place] Providers Cardiologist:  Janina Mayo, MD     Referring MD: Donald Prose, MD   Chief Complaint  Patient presents with   Irregular Heart Beat   new provider    History of Present Illness:    Sheri Moreno is a 81 y.o. female with a hx of hypertension, chronic diastolic heart failure, type 2 diabetes mellitus, OSA, borderline obesity who recently received a dual-chamber permanent pacemaker (Dr. 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  She is here for transition of care from Dr. Phineas Inches and we performed an interrogation of her device today.  The pacemaker recorded a 13-hour episode of paroxysmal atrial fibrillation that occurred on 07/01/2022, starting around 8 PM in the evening.  Rate control was satisfactory with a mean ventricular rate of 97 bpm.  She was completely asymptomatic.  Pacemaker function is otherwise normal.  Battery is at beginning of life with an estimated longevity of 14.5 years.  All lead parameters are good.  There is 4.5% atrial pacing and only 3.7% ventricular pacing.  Heart rate histogram distribution is normal.  She has no cardiovascular complaints.  She was completely unaware of the episode of atrial fibrillation last week. The patient specifically denies any chest pain at rest or with exertion, dyspnea at rest or with exertion, orthopnea, paroxysmal nocturnal dyspnea, syncope, palpitations, focal neurological deficits, intermittent claudication, lower extremity edema, unexplained weight gain, cough, hemoptysis or wheezing.  She does not have a history of frequent falls and has not had any recent injuries or bleeding problems.  Her blood pressure is well controlled.  Note that she takes both amlodipine and  simvastatin but has not had any side effects from this combination which she has been taking for quite a while and is taking a low-dose of simvastatin.  On this medication her most recent LDL cholesterol was excellent at 59.  She is receiving oral antidiabetics only (metformin and glipizide) and her most recent hemoglobin A1c was 7.0%.    [name]'s grandson is Delene Ruffini.   Past Medical History:  Diagnosis Date   Cataract    Hypertension    Obesity     Past Surgical History:  Procedure Laterality Date   CATARACT EXTRACTION     PACEMAKER IMPLANT N/A 02/24/2022   Procedure: PACEMAKER IMPLANT;  Surgeon: Evans Lance, MD;  Location: [facility];  Service: Cardiovascular;  Laterality: N/A;   TONSILLECTOMY      Current Medications: Current Meds  Medication Sig   amLODipine (NORVASC) 10 MG tablet Take 10 mg by mouth daily.   apixaban (ELIQUIS) 5 MG TABS tablet Take 1 tablet (5 mg total) by mouth 2 (two) times daily.   Calcium Carb-Cholecalciferol 600-10 MG-MCG TABS Take 1 tablet by mouth daily.   furosemide (LASIX) 20 MG tablet Take 1 tablet (20 mg total) by mouth daily.   glipiZIDE (GLUCOTROL) 10 MG tablet Take 10 mg by mouth daily before breakfast.   metFORMIN (GLUCOPHAGE-XR) 500 MG 24 hr tablet Take 1,000 mg by mouth daily with breakfast.   metoprolol succinate (TOPROL-XL) 25 MG 24 hr tablet Take 1 tablet (25 mg total) by mouth daily.   Misc Natural Products (OSTEO BI-FLEX ADV JOINT SHIELD PO) Take 1 tablet by mouth daily.  Omega-3 1000 MG CAPS Take 1,000 mg by mouth daily in the afternoon.   simvastatin (ZOCOR) 20 MG tablet Take 20 mg by mouth daily.     Allergies:   Ace inhibitors, Clindamycin hcl, Ethyl chloride, and Penicillins   Social History   Socioeconomic History   Marital status: Single    Spouse name: Not on file   Number of children: Not on file   Years of education: Not on file   Highest education level: Not on file  Occupational History    Occupation: Ecologist  Tobacco Use   Smoking status: Never   Smokeless tobacco: Never  Substance and Sexual Activity   Alcohol use: Not Currently    Comment: rarely   Drug use: Not on file   Sexual activity: Not on file  Other Topics Concern   Not on file  Social History Narrative   Not on file   Social Determinants of Health   Financial Resource Strain: Not on file  Food Insecurity: Not on file  Transportation Needs: Not on file  Physical Activity: Not on file  Stress: Not on file  Social Connections: Not on file     Family History: The patient's family history includes Hypertension in an other family member; Lung cancer in her maternal aunt; Pancreatic cancer in her sister; Stomach cancer in her father.  ROS:   Please see the history of present illness.     All other systems reviewed and are negative.  EKGs/Labs/Other Studies Reviewed:    The following studies were reviewed today: Comprehensive pacemaker check performed by me.  EKG:  EKG is not ordered today.  The ekg ordered 06/02/2022 demonstrates normal sinus rhythm with 1: 1 AV conduction and incomplete right bundle branch block.  The intracardiac electrogram today shows atrial sensed, ventricular sensed rhythm (normal sinus rhythm).  Recent Labs: 02/22/2022: B Natriuretic Peptide 978.0; Magnesium 1.7; TSH 6.910 02/23/2022: ALT 14 02/24/2022: Hemoglobin 12.0; Platelets 237 03/06/2022: BUN 21; Creatinine, Ser 1.04; Potassium 4.2; Sodium 138  Recent Lipid Panel    Component Value Date/Time   CHOL 156 02/23/2022 0551   TRIG 67 02/23/2022 0551   HDL 84 02/23/2022 0551   CHOLHDL 1.9 02/23/2022 0551   VLDL 13 02/23/2022 0551   LDLCALC 59 02/23/2022 0551     Risk Assessment/Calculations:    CHA2DS2-VASc Score = 6   This indicates a 9.7% annual risk of stroke. The patient's score is based upon: CHF History: 1 HTN History: 1 Diabetes History: 1 Stroke History: 0 Vascular Disease History: 0 Age  Score: 2 Gender Score: 1          Physical Exam:    VS:  BP 134/62 (BP Location: Left Arm, Patient Position: Sitting, Cuff Size: Normal)   Pulse 90   Ht '5\' 5"'$  (1.651 m)   Wt 182 lb (82.6 kg)   SpO2 91%   BMI 30.29 kg/m     Wt Readings from Last 3 Encounters:  07/09/22 182 lb (82.6 kg)  06/02/22 181 lb 3.2 oz (82.2 kg)  03/27/22 181 lb (82.1 kg)     GEN: Borderline obese.  Well nourished, well developed in no acute distress HEENT: Normal NECK: No JVD; No carotid bruits LYMPHATICS: No lymphadenopathy CARDIAC: Well-healed left subclavian pacemaker site.  RRR, no murmurs, rubs, gallops RESPIRATORY:  Clear to auscultation without rales, wheezing or rhonchi  ABDOMEN: Soft, non-tender, non-distended MUSCULOSKELETAL:  No edema; No deformity  SKIN: Warm and dry NEUROLOGIC:  Alert and oriented x 3  PSYCHIATRIC:  Normal affect   ASSESSMENT:    1. Paroxysmal atrial fibrillation (HCC)   2. CHB (complete heart block) (HCC)   3. Pacemaker   4. Essential hypertension   5. Chronic diastolic heart failure ([place])   6. Type 2 diabetes mellitus without complication, without long-term current use of insulin (HCC)   7. Hypercholesterolemia    PLAN:    In order of problems listed above:  Paroxysmal atrial fibrillation: We discussed the implications of this rhythm primarily as it pertains to the increased risk of stroke.  Strongly recommended that she start oral anticoagulants and prescribed Eliquis 5 mg twice daily.  Discussed the pros and cons of this medication and the potential of the increased risk of bleeding, that however is overshadowed by the risk of embolic stroke.  She has spontaneous good rate control, probably because she has underlying AV nodal disease.  She is on a tiny dose of metoprolol and this does not need to be adjusted.  Antiarrhythmics are not indicated since she is completely asymptomatic. CHB: Paroxysmal.  Was severely symptomatic, but requires very little ventricular  pacing. PPM: Normal device function.  Followed by Dr. Lovena Le. HTN: Blood pressure well controlled. CHF: Appears clinically euvolemic.  NYHA functional class I. DM: Good glycemic control with recent hemoglobin A1c of 7.0%. HLP: All lipid parameters in desirable range on statin.  Note excellent HDL cholesterol level.  Note potential interaction between amlodipine and simvastatin, but she is taking a low-dose of simvastatin and the combination is clearly effective and has not caused any side effects to date.            Medication Adjustments/Labs and Tests Ordered: Current medicines are reviewed at length with the patient today.  Concerns regarding medicines are outlined above.  Orders Placed This Encounter  Procedures   Pacemaker external - parameters   Meds ordered this encounter  Medications   apixaban (ELIQUIS) 5 MG TABS tablet    Sig: Take 1 tablet (5 mg total) by mouth 2 (two) times daily.    Dispense:  60 tablet    Refill:  11    Patient Instructions  Medication Instructions:  START Eliquis 5 mg twice daily  *If you need a refill on your cardiac medications before your next appointment, please call your pharmacy*   Lab Work: None ordered If you have labs (blood work) drawn today and your tests are completely normal, you will receive your results only by: [place] (if you have MyChart) OR A paper copy in the mail If you have any lab test that is abnormal or we need to change your treatment, we will call you to review the results.   Testing/Procedures: None ordered   Follow-Up: At [facility], you and your health needs are our priority.  As part of our continuing mission to provide you with exceptional heart care, we have created designated Provider Care Teams.  These Care Teams include your primary Cardiologist (physician) and Advanced Practice Providers (APPs -  Physician Assistants and Nurse Practitioners) who all work together to provide you with the  care you need, when you need it.  We recommend signing up for the patient portal called "MyChart".  Sign up information is provided on this After Visit Summary.  MyChart is used to connect with patients for Virtual Visits (Telemedicine).  Patients are able to view lab/test results, encounter notes, upcoming appointments, etc.  Non-urgent messages can be sent to your provider as well.   To learn more  about what you can do with MyChart, go to [email or web address].    Your next appointment:   Follow up in December on a device day with Dr. Sallyanne Kuster  Important Information About Sugar         Signed, Sanda Klein, MD  07/13/2022 12:07 AM    [place]

## 2022-07-12 ENCOUNTER — Encounter: Payer: Self-pay | Admitting: Cardiovascular Disease

## 2022-07-12 NOTE — Progress Notes (Incomplete)
Cardiology Office Note:    Date:  07/13/2022   ID:  Sheri Moreno, DOB 21-May-1941, MRN [id number]  PCP:  Sheri Prose, MD   [place] Providers Cardiologist:  Sheri Mayo, MD { Click to update primary MD,subspecialty MD or APP then REFRESH:1}    Referring MD: Sheri Prose, MD   Chief Complaint  Patient presents with  . Irregular Heart Beat  . new provider    History of Present Illness:    Sheri Moreno is a 81 y.o. female with a hx of hypertension, chronic diastolic heart failure, type 2 diabetes mellitus, OSA, borderline obesity who recently received a dual-chamber permanent pacemaker (Dr. 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  She is here for transition of care from Sheri Moreno and we performed an interrogation of her device today.  The pacemaker recorded a 13-hour episode of paroxysmal atrial fibrillation that occurred on 07/01/2022, starting around 8 PM in the evening.  Rate control was satisfactory with a mean ventricular rate of 97 bpm.  She was completely asymptomatic.  Pacemaker function is otherwise normal.  Battery is at beginning of life with an estimated longevity of 14.5 years.  All lead parameters are good.  There is 4.5% atrial pacing and only 3.7% ventricular pacing.  Heart rate histogram distribution is normal.  She has no cardiovascular complaints.  She was completely unaware of the episode of atrial fibrillation last week. The patient specifically denies any chest pain at rest or with exertion, dyspnea at rest or with exertion, orthopnea, paroxysmal nocturnal dyspnea, syncope, palpitations, focal neurological deficits, intermittent claudication, lower extremity edema, unexplained weight gain, cough, hemoptysis or wheezing.  She does not have a history of frequent falls and has not had any recent injuries or bleeding problems.  Her blood  pressure is well controlled.  Note that she takes both amlodipine and simvastatin but has not had any side effects from this combination which she has been taking for quite a while and is taking a low-dose of simvastatin.  On this medication her most recent LDL cholesterol was excellent at 59.  She is receiving oral antidiabetics only (metformin and glipizide) and her most recent hemoglobin A1c was 7.0%.    Sheri Moreno's grandson is Sheri Moreno.   Past Medical History:  Diagnosis Date  . Cataract   . Hypertension   . Obesity     Past Surgical History:  Procedure Laterality Date  . CATARACT EXTRACTION    . PACEMAKER IMPLANT N/A 02/24/2022   Procedure: PACEMAKER IMPLANT;  Surgeon: Sheri Lance, MD;  Location: [facility];  Service: Cardiovascular;  Laterality: N/A;  . TONSILLECTOMY      Current Medications: Current Meds  Medication Sig  . amLODipine (NORVASC) 10 MG tablet Take 10 mg by mouth daily.  Marland Kitchen apixaban (ELIQUIS) 5 MG TABS tablet Take 1 tablet (5 mg total) by mouth 2 (two) times daily.  . Calcium Carb-Cholecalciferol 600-10 MG-MCG TABS Take 1 tablet by mouth daily.  . furosemide (LASIX) 20 MG tablet Take 1 tablet (20 mg total) by mouth daily.  Marland Kitchen glipiZIDE (GLUCOTROL) 10 MG tablet Take 10 mg by mouth daily before breakfast.  . metFORMIN (GLUCOPHAGE-XR) 500 MG 24 hr tablet Take 1,000 mg by mouth daily with breakfast.  . metoprolol succinate (TOPROL-XL) 25 MG 24 hr tablet Take 1 tablet (25 mg total) by mouth daily.  . Misc Natural Products (OSTEO BI-FLEX  ADV JOINT SHIELD PO) Take 1 tablet by mouth daily.    . Omega-3 1000 MG CAPS Take 1,000 mg by mouth daily in the afternoon.  . simvastatin (ZOCOR) 20 MG tablet Take 20 mg by mouth daily.     Allergies:   Ace inhibitors, Clindamycin hcl, Ethyl chloride, and Penicillins   Social History   Socioeconomic History  . Marital status: Single    Spouse name: Not on file  . Number of children: Not on file  . Years of  education: Not on file  . Highest education level: Not on file  Occupational History  . Occupation: Ecologist  Tobacco Use  . Smoking status: Never  . Smokeless tobacco: Never  Substance and Sexual Activity  . Alcohol use: Not Currently    Comment: rarely  . Drug use: Not on file  . Sexual activity: Not on file  Other Topics Concern  . Not on file  Social History Narrative  . Not on file   Social Determinants of Health   Financial Resource Strain: Not on file  Food Insecurity: Not on file  Transportation Needs: Not on file  Physical Activity: Not on file  Stress: Not on file  Social Connections: Not on file     Family History: The patient's family history includes Hypertension in an other family member; Lung cancer in her maternal aunt; Pancreatic cancer in her sister; Stomach cancer in her father.  ROS:   Please see the history of present illness.     All other systems reviewed and are negative.  EKGs/Labs/Other Studies Reviewed:    The following studies were reviewed today: Comprehensive pacemaker check performed by me.  EKG:  EKG is not ordered today.  The ekg ordered 06/02/2022 demonstrates normal sinus rhythm with 1: 1 AV conduction and incomplete right bundle branch block.  The intracardiac electrogram today shows atrial sensed, ventricular sensed rhythm (normal sinus rhythm).  Recent Labs: 02/22/2022: B Natriuretic Peptide 978.0; Magnesium 1.7; TSH 6.910 02/23/2022: ALT 14 02/24/2022: Hemoglobin 12.0; Platelets 237 03/06/2022: BUN 21; Creatinine, Ser 1.04; Potassium 4.2; Sodium 138  Recent Lipid Panel    Component Value Date/Time   CHOL 156 02/23/2022 0551   TRIG 67 02/23/2022 0551   HDL 84 02/23/2022 0551   CHOLHDL 1.9 02/23/2022 0551   VLDL 13 02/23/2022 0551   LDLCALC 59 02/23/2022 0551     Risk Assessment/Calculations:    [id number] Score = 6  {Click here to calculate score.  REFRESH note before signing. :1} This indicates a 9.7% annual  risk of stroke. The patient's score is based upon: CHF History: 1 HTN History: 1 Diabetes History: 1 Stroke History: 0 Vascular Disease History: 0 Age Score: 2 Gender Score: 1   {This patient has a significant risk of stroke if diagnosed with atrial fibrillation.  Please consider VKA or DOAC agent for anticoagulation if the bleeding risk is acceptable.   You can also use the SmartPhrase .[place] for documentation.   :[id number]}       Physical Exam:    VS:  BP 134/62 (BP Location: Left Arm, Patient Position: Sitting, Cuff Size: Normal)   Pulse 90   Ht '5\' 5"'$  (1.651 m)   Wt 182 lb (82.6 kg)   SpO2 91%   BMI 30.29 kg/m     Wt Readings from Last 3 Encounters:  07/09/22 182 lb (82.6 kg)  06/02/22 181 lb 3.2 oz (82.2 kg)  03/27/22 181 lb (82.1 kg)     GEN: Borderline obese.  Well nourished, well developed in no acute distress HEENT: Normal NECK: No JVD; No carotid bruits LYMPHATICS: No lymphadenopathy CARDIAC: Well-healed left subclavian pacemaker site.  RRR, no murmurs, rubs, gallops RESPIRATORY:  Clear to auscultation without rales, wheezing or rhonchi  ABDOMEN: Soft, non-tender, non-distended MUSCULOSKELETAL:  No edema; No deformity  SKIN: Warm and dry NEUROLOGIC:  Alert and oriented x 3 PSYCHIATRIC:  Normal affect   ASSESSMENT:    No diagnosis found. PLAN:    In order of problems listed above:  Paroxysmal atrial fibrillation: We discussed the implications of this rhythm primarily as it pertains to the increased risk of stroke.  Strongly recommended that she start oral anticoagulants and prescribed Eliquis 5 mg twice daily.  Discussed the pros and cons of this medication and the potential of the increased risk of bleeding, that however is overshadowed by the risk of embolic stroke.  She has spontaneous good rate control, probably because she has underlying AV nodal disease.  She is on a tiny dose of metoprolol and this does not need to be adjusted.  Antiarrhythmics  are not indicated since she is completely asymptomatic. HTN: Blood pressure well controlled. DM: Good glycemic control with recent hemoglobin A1c of 7.0%. HLP:      {Are you ordering a CV Procedure (e.g. stress test, cath, DCCV, TEE, etc)?   Press F2        :[id number]}    Medication Adjustments/Labs and Tests Ordered: Current medicines are reviewed at length with the patient today.  Concerns regarding medicines are outlined above.  Orders Placed This Encounter  Procedures  . Pacemaker external - parameters   Meds ordered this encounter  Medications  . apixaban (ELIQUIS) 5 MG TABS tablet    Sig: Take 1 tablet (5 mg total) by mouth 2 (two) times daily.    Dispense:  60 tablet    Refill:  11    Patient Instructions  Medication Instructions:  START Eliquis 5 mg twice daily  *If you need a refill on your cardiac medications before your next appointment, please call your pharmacy*   Lab Work: None ordered If you have labs (blood work) drawn today and your tests are completely normal, you will receive your results only by: [place] (if you have MyChart) OR A paper copy in the mail If you have any lab test that is abnormal or we need to change your treatment, we will call you to review the results.   Testing/Procedures: None ordered   Follow-Up: At [facility], you and your health needs are our priority.  As part of our continuing mission to provide you with exceptional heart care, we have created designated Provider Care Teams.  These Care Teams include your primary Cardiologist (physician) and Advanced Practice Providers (APPs -  Physician Assistants and Nurse Practitioners) who all work together to provide you with the care you need, when you need it.  We recommend signing up for the patient portal called "MyChart".  Sign up information is provided on this After Visit Summary.  MyChart is used to connect with patients for Virtual Visits (Telemedicine).  Patients are  able to view lab/test results, encounter notes, upcoming appointments, etc.  Non-urgent messages can be sent to your provider as well.   To learn more about what you can do with MyChart, go to [email or web address].    Your next appointment:   Follow up in December on a device day with Dr. Sallyanne Kuster  Important Information About Sugar  Signed, Sanda Klein, MD  07/13/2022 12:02 AM    [place]

## 2022-07-13 DIAGNOSIS — I442 Atrioventricular block, complete: Secondary | ICD-10-CM | POA: Insufficient documentation

## 2022-07-13 DIAGNOSIS — E7800 Pure hypercholesterolemia, unspecified: Secondary | ICD-10-CM | POA: Insufficient documentation

## 2022-07-13 DIAGNOSIS — Z950 Presence of cardiac pacemaker: Secondary | ICD-10-CM | POA: Insufficient documentation

## 2022-07-13 DIAGNOSIS — I480 Paroxysmal atrial fibrillation: Secondary | ICD-10-CM | POA: Insufficient documentation

## 2022-07-13 DIAGNOSIS — E119 Type 2 diabetes mellitus without complications: Secondary | ICD-10-CM | POA: Insufficient documentation

## 2022-08-02 ENCOUNTER — Emergency Department (HOSPITAL_BASED_OUTPATIENT_CLINIC_OR_DEPARTMENT_OTHER)
Admission: EM | Admit: 2022-08-02 | Discharge: 2022-08-03 | Disposition: A | Discharge status: Home / Self Care | Payer: Medicare HMO | Attending: Emergency Medicine | Admitting: Emergency Medicine

## 2022-08-02 ENCOUNTER — Emergency Department (HOSPITAL_BASED_OUTPATIENT_CLINIC_OR_DEPARTMENT_OTHER): Payer: Medicare HMO | Admitting: Radiology

## 2022-08-02 ENCOUNTER — Other Ambulatory Visit: Payer: Self-pay

## 2022-08-02 ENCOUNTER — Encounter (HOSPITAL_BASED_OUTPATIENT_CLINIC_OR_DEPARTMENT_OTHER): Payer: Self-pay | Admitting: Emergency Medicine

## 2022-08-02 DIAGNOSIS — S90561A Insect bite (nonvenomous), right ankle, initial encounter: Secondary | ICD-10-CM | POA: Diagnosis not present

## 2022-08-02 DIAGNOSIS — W5911XA Bitten by nonvenomous snake, initial encounter: Secondary | ICD-10-CM | POA: Diagnosis not present

## 2022-08-02 DIAGNOSIS — I5033 Acute on chronic diastolic (congestive) heart failure: Secondary | ICD-10-CM | POA: Insufficient documentation

## 2022-08-02 DIAGNOSIS — Z7984 Long term (current) use of oral hypoglycemic drugs: Secondary | ICD-10-CM | POA: Diagnosis not present

## 2022-08-02 DIAGNOSIS — Z7901 Long term (current) use of anticoagulants: Secondary | ICD-10-CM | POA: Insufficient documentation

## 2022-08-02 DIAGNOSIS — Z79899 Other long term (current) drug therapy: Secondary | ICD-10-CM | POA: Diagnosis not present

## 2022-08-02 DIAGNOSIS — Z950 Presence of cardiac pacemaker: Secondary | ICD-10-CM | POA: Diagnosis not present

## 2022-08-02 DIAGNOSIS — E119 Type 2 diabetes mellitus without complications: Secondary | ICD-10-CM | POA: Diagnosis not present

## 2022-08-02 DIAGNOSIS — S93401A Sprain of unspecified ligament of right ankle, initial encounter: Secondary | ICD-10-CM | POA: Diagnosis not present

## 2022-08-02 DIAGNOSIS — T63001A Toxic effect of unspecified snake venom, accidental (unintentional), initial encounter: Secondary | ICD-10-CM | POA: Diagnosis not present

## 2022-08-02 DIAGNOSIS — I110 Hypertensive heart disease with heart failure: Secondary | ICD-10-CM | POA: Insufficient documentation

## 2022-08-02 HISTORY — DX: Type 2 diabetes mellitus without complications: E11.9

## 2022-08-02 NOTE — ED Triage Notes (Signed)
Bite by snake. Back of right lower leg ankle area. Stepped on snake and possibly rolled right ankle. Some increase swelling and bruising to area, bleeding is controlled. Pain 4/10  Happened at around 8:30pm. Unable to find snake after. Did not call animal control prior to coming + cap refill, + distal pulse

## 2022-08-03 LAB — BASIC METABOLIC PANEL
Anion gap: 13 (ref 5–15)
BUN: 22 mg/dL (ref 8–23)
CO2: 23 mmol/L (ref 22–32)
Calcium: 10.2 mg/dL (ref 8.9–10.3)
Chloride: 102 mmol/L (ref 98–111)
Creatinine, Ser: 0.92 mg/dL (ref 0.44–1.00)
GFR, Estimated: >60 mL/min (ref >60)
Glucose, Bld: 179 mg/dL — ABNORMAL HIGH (ref 70–99)
Potassium: 3.9 mmol/L (ref 3.5–5.1)
Sodium: 138 mmol/L (ref 135–145)

## 2022-08-03 LAB — CBC WITH DIFFERENTIAL/PLATELET
Abs Immature Granulocytes: 0.04 10*3/uL (ref 0.00–0.07)
Basophils Absolute: 0.1 10*3/uL (ref 0.0–0.1)
Basophils Relative: 1 %
Eosinophils Absolute: 0.2 10*3/uL (ref 0.0–0.5)
Eosinophils Relative: 2 %
HCT: 39.3 % (ref 36.0–46.0)
Hemoglobin: 13.5 g/dL (ref 12.0–15.0)
Immature Granulocytes: 0 %
Lymphocytes Relative: 33 %
Lymphs Abs: 3.4 10*3/uL (ref 0.7–4.0)
MCH: 28.5 pg (ref 26.0–34.0)
MCHC: 34.4 g/dL (ref 30.0–36.0)
MCV: 82.9 fL (ref 80.0–100.0)
Monocytes Absolute: 0.8 10*3/uL (ref 0.1–1.0)
Monocytes Relative: 7 %
Neutro Abs: 5.8 10*3/uL (ref 1.7–7.7)
Neutrophils Relative %: 57 %
Platelets: 302 10*3/uL (ref 150–400)
RBC: 4.74 MIL/uL (ref 3.87–5.11)
RDW: 13.5 % (ref 11.5–15.5)
WBC: 10.3 10*3/uL (ref 4.0–10.5)
nRBC: 0 % (ref 0.0–0.2)

## 2022-08-03 LAB — FIBRINOGEN: Fibrinogen: 511 mg/dL — ABNORMAL HIGH (ref 210–475)

## 2022-08-03 LAB — PROTIME-INR
INR: 1 (ref 0.8–1.2)
Prothrombin Time: 13 seconds (ref 11.4–15.2)

## 2022-08-03 MED ORDER — ACETAMINOPHEN 325 MG PO TABS
650.0000 mg | ORAL_TABLET | Freq: Once | ORAL | Status: AC
Start: 2022-08-03 — End: 2022-08-03
  Administered 2022-08-03: 650 mg via ORAL
  Filled 2022-08-03: qty 2

## 2022-08-03 NOTE — ED Notes (Signed)
Report received 

## 2022-08-03 NOTE — ED Notes (Signed)
No further swelling or increase in measurements to snake bite area. Ace wrap placed per order. Voiced all understanding of d/c instructions. Assisted to car. Pt's daughter drove her home.   Foot 19cm Ankle 21cm Calf 28cm Thigh 42 cm

## 2022-08-03 NOTE — ED Provider Notes (Signed)
[facility] EMERGENC[facility] DEPT Provider Note  CSN: [id number] Arrival date & time: 08/02/22 2119  Chief Complaint(s) Snake Bite  HPI Sheri Moreno is a 81 y.o. female with a past medical history listed below including paroxysmal A-fib on Eliquis, type 2 diabetes, hypertension  The history is provided by the patient.  Animal Bite Contact animal:  Snake Location:  Foot Foot injury location:  R ankle Time since incident: at 8:30p. Pain details:    Quality:  Aching   Severity:  Moderate   Timing:  Constant   Progression:  Unchanged Incident location:  Home Relieved by:  Nothing Worsened by:  Nothing Associated symptoms: swelling   Associated symptoms: no fever and no numbness     Past Medical History Past Medical History:  Diagnosis Date   Cataract    Diabetes mellitus without complication ([place])    Hypertension    Obesity    Patient Active Problem List   Diagnosis Date Noted   Paroxysmal atrial fibrillation ([place]) 07/13/2022   CHB (complete heart block) ([place]) 07/13/2022   Pacemaker 07/13/2022   Hypercholesterolemia 07/13/2022   Type 2 diabetes mellitus without complication, without long-term current use of insulin ([place]) 07/13/2022   Retinal pigment epithelial detachment, 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 Authorizing Provider  amLODipine (NORVASC) 10 MG tablet Take 10 mg by mouth daily. 02/17/22   [provider]  apixaban (ELIQUIS) 5 MG TABS tablet Take 1 tablet (5 mg total) by mouth 2 (two) times daily. 07/09/22   Croitoru, Mihai, MD  Calcium Carb-Cholecalciferol 600-10 MG-MCG TABS Take 1 tablet by mouth daily.    [provider]  furosemide (LASIX) 20 MG tablet Take 1 tablet (20 mg total) by mouth daily. 02/28/22   Evans Lance, MD  glipiZIDE (GLUCOTROL) 10 MG tablet Take 10 mg by mouth daily before breakfast.    [provider]  metFORMIN (GLUCOPHAGE-XR) 500 MG 24 hr tablet Take 1,000 mg by mouth daily with breakfast.    [provider]  metoprolol succinate (TOPROL-XL) 25 MG 24 hr tablet Take 1 tablet (25 mg total) by mouth daily. 02/25/22   Shirley Friar, PA-C  Misc Natural Products (OSTEO BI-FLEX ADV JOINT SHIELD PO) Take 1 tablet by mouth daily.      [provider]  Omega-3 1000 MG CAPS Take 1,000 mg by mouth daily in the afternoon. 07/08/22   [provider]  simvastatin (ZOCOR) 20 MG tablet Take 20 mg by mouth daily.    [provider]  Allergies Ace inhibitors, Clindamycin hcl, Ethyl chloride, and Penicillins  Review of Systems Review of Systems  Constitutional:  Negative for fever.  Neurological:  Negative for numbness.   As noted in HPI  Physical Exam Vital Signs  I have reviewed the triage vital signs BP 139/66   Pulse 77   Temp 97.7 F (36.5 C) (Oral)   Resp 18   SpO2 95%   Physical Exam Vitals reviewed.  Constitutional:      General: She is not in acute distress.    Appearance: She is well-developed. She is not diaphoretic.  HENT:     Head: Normocephalic and atraumatic.     Right Ear: External ear normal.     Left Ear: External ear normal.     Nose: Nose normal.  Eyes:     General: No scleral icterus.    Conjunctiva/sclera: Conjunctivae normal.  Neck:      Trachea: Phonation normal.  Cardiovascular:     Rate and Rhythm: Normal rate and regular rhythm.  Pulmonary:     Effort: Pulmonary effort is normal. No respiratory distress.     Breath sounds: No stridor.  Abdominal:     General: There is no distension.  Musculoskeletal:        General: Normal range of motion.     Cervical back: Normal range of motion.     Right lower leg: Swelling present. Edema present.     Left lower leg: Edema present.     Right ankle: Swelling and ecchymosis present. Tenderness present.       Feet:  Neurological:     Mental Status: She is alert and oriented to person, place, and time.  Psychiatric:        Behavior: Behavior normal.     ED Results and Treatments Labs (all labs ordered are listed, but only abnormal results are displayed) Labs Reviewed  BASIC METABOLIC PANEL - Abnormal; Notable for the following components:      Result Value   Glucose, Bld 179 (*)    All other components within normal limits  FIBRINOGEN - Abnormal; Notable for the following components:   Fibrinogen 511 (*)    All other components within normal limits  CBC WITH DIFFERENTIAL/PLATELET  PROTIME-INR  CBC WITH DIFFERENTIAL/PLATELET  CBC WITH DIFFERENTIAL/PLATELET  PROTIME-INR  PROTIME-INR  FIBRINOGEN  FIBRINOGEN                                                                                                                         EKG  EKG Interpretation  Date/Time:    Ventricular Rate:    PR Interval:    QRS Duration:   QT Interval:    QTC Calculation:   R Axis:     Text Interpretation:         Radiology DG Ankle Complete Right  Result Date: 08/02/2022 CLINICAL DATA:  Twisted ankle EXAM: RIGHT ANKLE - COMPLETE 3+ VIEW COMPARISON:  None Available. FINDINGS: Diffuse severe soft tissue swelling.  No acute bony abnormality. Specifically, no fracture, subluxation, or dislocation. Joint spaces maintained. IMPRESSION: No acute bony abnormality. Electronically Signed    By: Rolm Baptise M.D.   On: 08/02/2022 22:26    Medications Ordered in ED Medications  acetaminophen (TYLENOL) tablet 650 mg (has no administration in time range)                                                                                                                                     Procedures Procedures  (including critical care time)  Medical Decision Making / ED Course   Medical Decision Making Amount and/or Complexity of Data Reviewed Labs: ordered. Decision-making details documented in ED Course. Radiology: ordered and independent interpretation performed. Decision-making details documented in ED Course.    Patient sustained a snake bite to the right ankle Reports that she might of twisted her ankle after stepping on a snake. Snakebite protocol measurements and labs.  Labs reassuring CBC without leukocytosis or anemia Metabolic panel without significant electrolyte derangement or renal sufficiency Coags stable.  Measurements revealed that the swelling is actually improving.  After 6 hours of monitoring, almost 7 hours after initial bite, patient was felt to be stable for discharge home.      Final Clinical Impression(s) / ED Diagnoses Final diagnoses:  Snake bite, initial encounter   The patient appears reasonably screened and/or stabilized for discharge and I doubt any other medical condition or other [facility] requiring further screening, evaluation, or treatment in the ED at this time. I have discussed the findings, Dx and Tx plan with the patient/family who expressed understanding and agree(s) with the plan. Discharge instructions discussed at length. The patient/family was given strict return precautions who verbalized understanding of the instructions. No further questions at time of discharge.  Disposition: Discharge  Condition: Good  ED Discharge Orders     None         Follow Up: Donald Prose, MD [place]  [id number] [phone]  Call  to schedule an appointment for close follow up           This chart was dictated using voice recognition software.  Despite best efforts to proofread,  errors can occur which can change the documentation meaning.    Fatima Blank, MD 08/03/22 [phone]

## 2022-08-03 NOTE — ED Notes (Addendum)
Poison Control notified of snake bite. Instructions for pt. care were faxed over from poison control and given to provider.

## 2022-08-03 NOTE — ED Notes (Signed)
Foot 19cm Ankle 21cm Calf 28cm Thigh 42 cm  There is not increase in measurements from 2327 done by Johny Sax, RN

## 2022-08-03 NOTE — ED Notes (Signed)
Patient's daughter at bedside was verbally aggressive towards this RN as I attempted to draw labs. The family stated "she's a nurse" and that "she can start the IV." Patient and daughter made aware this is not allowed. This RN requested patient's family member to kindly step outside. Patient refused, notified charge RN [place].

## 2022-08-03 NOTE — ED Notes (Signed)
Poison control called and updated

## 2022-08-04 ENCOUNTER — Encounter: Payer: Self-pay | Admitting: Cardiovascular Disease

## 2022-08-04 DIAGNOSIS — I5032 Chronic diastolic (congestive) heart failure: Secondary | ICD-10-CM

## 2022-08-04 NOTE — Telephone Encounter (Signed)
Please change furosemide to a weight-based schedule, given new prescription and also had a prescription for potassium chloride 20 mEq tabs: -Furosemide 40 mg daily and potassium 20 mEq daily if weight is 176 pounds or lower -Furosemide 80 mg daily and potassium 40 mill equivalents daily if weight is 177 pounds or higher. Please check BMET to being on this new regimen for a couple of weeks.  Please make her a follow-up appointment to see me in 3-4 months, but will bring in sooner if this does not work well.

## 2022-08-05 MED ORDER — FUROSEMIDE 40 MG PO TABS: 40.0000 mg | ORAL_TABLET | Freq: Every day | ORAL | 3 refills | Status: DC

## 2022-08-05 MED ORDER — POTASSIUM CHLORIDE CRYS ER 20 MEQ PO TBCR: 20.0000 meq | EXTENDED_RELEASE_TABLET | Freq: Every day | ORAL | 3 refills | Status: DC

## 2022-09-04 ENCOUNTER — Ambulatory Visit (INDEPENDENT_AMBULATORY_CARE_PROVIDER_SITE_OTHER): Payer: Medicare HMO

## 2022-09-04 DIAGNOSIS — I442 Atrioventricular block, complete: Secondary | ICD-10-CM

## 2022-09-04 LAB — CUP PACEART REMOTE DEVICE CHECK
Battery Remaining Longevity: 173 mo
Battery Voltage: 3.18 V
Brady Statistic AP VP Percent: 2.86 %
Brady Statistic AP VS Percent: 0.85 %
Brady Statistic AS VP Percent: 19.35 %
Brady Statistic AS VS Percent: 76.94 %
Brady Statistic RA Percent Paced: 3.83 %
Brady Statistic RV Percent Paced: 22.21 %
Date Time Interrogation Session: 20230927235941
Implantable Lead Implant Date: 20230320
Implantable Lead Implant Date: 20230320
Implantable Lead Location: 753859
Implantable Lead Location: 753860
Implantable Lead Model: 3830
Implantable Lead Model: 5076
Implantable Pulse Generator Implant Date: 20230320
Lead Channel Impedance Value: 323 Ohm
Lead Channel Impedance Value: 456 Ohm
Lead Channel Impedance Value: 513 Ohm
Lead Channel Impedance Value: 589 Ohm
Lead Channel Pacing Threshold Amplitude: 0.375 V
Lead Channel Pacing Threshold Amplitude: 0.75 V
Lead Channel Pacing Threshold Pulse Width: 0.4 ms
Lead Channel Pacing Threshold Pulse Width: 0.4 ms
Lead Channel Sensing Intrinsic Amplitude: 15.75 mV
Lead Channel Sensing Intrinsic Amplitude: 15.75 mV
Lead Channel Sensing Intrinsic Amplitude: 6.375 mV
Lead Channel Sensing Intrinsic Amplitude: 6.375 mV
Lead Channel Setting Pacing Amplitude: 1.5 V
Lead Channel Setting Pacing Amplitude: 2 V
Lead Channel Setting Pacing Pulse Width: 0.4 ms
Lead Channel Setting Sensing Sensitivity: 0.9 mV

## 2022-09-10 NOTE — Progress Notes (Signed)
Remote pacemaker transmission.   

## 2022-10-27 DIAGNOSIS — I10 Essential (primary) hypertension: Secondary | ICD-10-CM | POA: Diagnosis not present

## 2022-10-27 DIAGNOSIS — G4733 Obstructive sleep apnea (adult) (pediatric): Secondary | ICD-10-CM | POA: Diagnosis not present

## 2022-10-27 DIAGNOSIS — L989 Disorder of the skin and subcutaneous tissue, unspecified: Secondary | ICD-10-CM | POA: Diagnosis not present

## 2022-11-04 ENCOUNTER — Other Ambulatory Visit: Payer: Self-pay | Admitting: Cardiovascular Disease

## 2022-11-10 ENCOUNTER — Ambulatory Visit: Payer: Medicare HMO | Attending: Cardiovascular Disease | Admitting: Cardiovascular Disease

## 2022-11-10 ENCOUNTER — Encounter: Payer: Self-pay | Admitting: Cardiovascular Disease

## 2022-11-10 VITALS — BP 138/66 | HR 77 | Ht 65.0 in | Wt 177.0 lb

## 2022-11-10 DIAGNOSIS — I4729 Other ventricular tachycardia: Secondary | ICD-10-CM | POA: Diagnosis not present

## 2022-11-10 DIAGNOSIS — Z950 Presence of cardiac pacemaker: Secondary | ICD-10-CM

## 2022-11-10 DIAGNOSIS — D6869 Other thrombophilia: Secondary | ICD-10-CM | POA: Diagnosis not present

## 2022-11-10 DIAGNOSIS — E7800 Pure hypercholesterolemia, unspecified: Secondary | ICD-10-CM | POA: Diagnosis not present

## 2022-11-10 DIAGNOSIS — I10 Essential (primary) hypertension: Secondary | ICD-10-CM | POA: Diagnosis not present

## 2022-11-10 DIAGNOSIS — I480 Paroxysmal atrial fibrillation: Secondary | ICD-10-CM

## 2022-11-10 DIAGNOSIS — E119 Type 2 diabetes mellitus without complications: Secondary | ICD-10-CM | POA: Diagnosis not present

## 2022-11-10 DIAGNOSIS — I442 Atrioventricular block, complete: Secondary | ICD-10-CM

## 2022-11-10 DIAGNOSIS — I5032 Chronic diastolic (congestive) heart failure: Secondary | ICD-10-CM

## 2022-11-10 DIAGNOSIS — I342 Nonrheumatic mitral (valve) stenosis: Secondary | ICD-10-CM | POA: Diagnosis not present

## 2022-11-10 DIAGNOSIS — E038 Other specified hypothyroidism: Secondary | ICD-10-CM

## 2022-11-10 DIAGNOSIS — I358 Other nonrheumatic aortic valve disorders: Secondary | ICD-10-CM

## 2022-11-10 NOTE — Progress Notes (Signed)
Cardiology Office Note:    Date:  11/15/2022   ID:  Sheri Moreno, DOB 1940-12-31, MRN [id number]  PCP:  Donald Prose, [place] Providers Cardiologist:  None     Referring MD: Donald Prose, MD   No chief complaint on file.   History of Present Illness:    Sheri Moreno is a 81 y.o. female with a hx of hypertension, chronic diastolic heart failure, type 2 diabetes mellitus, OSA, borderline obesity who recently received a dual-chamber permanent pacemaker (Dr. 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  The pacemaker recorded a 13-hour episode of asymptomatic, rate-controlled paroxysmal atrial fibrillation that occurred on 07/01/2022. We started anticoagulation.  She has tolerated this well and has not had any bleeding problems.  Pacemaker interrogation shows normal device function.  She has only had 1 further episode of atrial fibrillation lasting about 3 minutes with ventricular rates in the 110 range, but she has had 5 episodes of nonsustained ventricular tachycardia although in the last 6 weeks or so.  They have all been brief, the longest lasting for 7 seconds, typically with a rate around 170 bpm.  She has 3% atrial pacing and 11% ventricular pacing.  She was completely oblivious to all these arrhythmic events.  She generally does not have any cardiovascular complaints other than mild exertional dyspnea. The patient specifically denies any chest pain at rest exertion, dyspnea at rest, orthopnea, paroxysmal nocturnal dyspnea, syncope, palpitations, focal neurological deficits, intermittent claudication, lower extremity edema, unexplained weight gain, cough, hemoptysis or wheezing.  She has not had any falls or bleeding problems.  She reports having frequent bouts of diarrhea due to irritable bowel syndrome, around the time of those arrhythmic events.  Recheck labs  today showed normal electrolytes.    She has normal left ventricular systolic function with an echo from March 2023, with evidence of moderate increase in mitral valve gradients due to severe mitral stenosis (mean gradient 11 mmHg at a heart rate of 46 bpm).  There was moderate increase in aortic valve gradients as well, but this was attributable to the high stroke-volume during bradycardia (mean gradient 19 mmHg, dimensionless index 0.74, stroke-volume index 59).  Her blood pressure is well controlled.  Note that she takes both amlodipine and simvastatin but has not had any side effects from this combination which she has been taking for quite a while and is taking a low-dose of simvastatin.  On this medication her most recent LDL cholesterol was excellent at 59.  She is receiving oral antidiabetics only (metformin and glipizide) and her most recent hemoglobin A1c was 7.0%.    Sheri Moreno's grandson is Sheri Moreno.   Past Medical History:  Diagnosis Date   Cataract    Diabetes mellitus without complication ([place])    Hypertension    Obesity     Past Surgical History:  Procedure Laterality Date   CATARACT EXTRACTION     PACEMAKER IMPLANT N/A 02/24/2022   Procedure: PACEMAKER IMPLANT;  Surgeon: Evans Lance, MD;  Location: [facility];  Service: Cardiovascular;  Laterality: N/A;   TONSILLECTOMY      Current Medications: Current Meds  Medication Sig   amLODipine (NORVASC) 10 MG tablet Take 10 mg by mouth daily.   apixaban (ELIQUIS) 5 MG TABS tablet Take 1 tablet (5 mg total) by mouth 2 (two) times daily.   Calcium Carb-Cholecalciferol 600-10 MG-MCG TABS Take 1 tablet  by mouth daily.   furosemide (LASIX) 40 MG tablet Take 1 tablet (40 mg total) by mouth daily. Take 80 mg once daily for a weight over 177 lbs   glipiZIDE (GLUCOTROL) 10 MG tablet Take 10 mg by mouth daily before breakfast.   metFORMIN (GLUCOPHAGE-XR) 500 MG 24 hr tablet Take 1,000 mg by mouth daily with  breakfast.   metoprolol succinate (TOPROL-XL) 25 MG 24 hr tablet Take 1 tablet (25 mg total) by mouth daily.   Misc Natural Products (OSTEO BI-FLEX ADV JOINT SHIELD PO) Take 1 tablet by mouth daily.     Omega-3 1000 MG CAPS Take 1,000 mg by mouth daily in the afternoon.   potassium chloride SA (KLOR-CON M20) 20 MEQ tablet Take 2 tablets (40 mEq total) by mouth daily.   simvastatin (ZOCOR) 20 MG tablet Take 20 mg by mouth daily.     Allergies:   Ace inhibitors, Clindamycin hcl, Ethyl chloride, and Penicillins   Social History   Socioeconomic History   Marital status: Single    Spouse name: Not on file   Number of children: Not on file   Years of education: Not on file   Highest education level: Not on file  Occupational History   Occupation: Ecologist  Tobacco Use   Smoking status: Never   Smokeless tobacco: Never  Substance and Sexual Activity   Alcohol use: Not Currently    Comment: rarely   Drug use: Not on file   Sexual activity: Not on file  Other Topics Concern   Not on file  Social History Narrative   Not on file   Social Determinants of Health   Financial Resource Strain: Not on file  Food Insecurity: Not on file  Transportation Needs: Not on file  Physical Activity: Not on file  Stress: Not on file  Social Connections: Not on file     Family History: The patient's family history includes Hypertension in an other family member; Lung cancer in her maternal aunt; Pancreatic cancer in her sister; Stomach cancer in her father.  ROS:   Please see the history of present illness.     All other systems reviewed and are negative.  EKGs/Labs/Other Studies Reviewed:    The following studies were reviewed today: Comprehensive pacemaker check performed by me.  EKG:  EKG is ordered today.  It shows normal sinus rhythm with incomplete right bundle branch block (QRS duration 110 ms), QTc 477 ms.    Recent Labs: 02/22/2022: B Natriuretic Peptide  978.0 02/23/2022: ALT 14 08/03/2022: Hemoglobin 13.5; Platelets 302 11/10/2022: BUN 22; Creatinine, Ser 1.05; Magnesium 2.0; Potassium 4.7; Sodium 139; TSH 7.320  Recent Lipid Panel    Component Value Date/Time   CHOL 156 02/23/2022 0551   TRIG 67 02/23/2022 0551   HDL 84 02/23/2022 0551   CHOLHDL 1.9 02/23/2022 0551   VLDL 13 02/23/2022 0551   LDLCALC 59 02/23/2022 0551     Risk Assessment/Calculations:    CHA2DS2-VASc Score = 6   This indicates a 9.7% annual risk of stroke. The patient's score is based upon: CHF History: 1 HTN History: 1 Diabetes History: 1 Stroke History: 0 Vascular Disease History: 0 Age Score: 2 Gender Score: 1          Physical Exam:    VS:  BP 138/66 (BP Location: Left Arm, Patient Position: Sitting, Cuff Size: Normal)   Pulse 77   Ht '5\' 5"'$  (1.651 m)   Wt 80.3 kg   SpO2 96%  BMI 29.45 kg/m     Wt Readings from Last 3 Encounters:  11/10/22 80.3 kg  07/09/22 82.6 kg  06/02/22 82.2 kg     General: Alert, oriented x3, no distress, well-healed subclavian pacemaker site Head: no evidence of trauma, PERRL, EOMI, no exophtalmos or lid lag, no myxedema, no xanthelasma; normal ears, nose and oropharynx Neck: normal jugular venous pulsations and no hepatojugular reflux; brisk carotid pulses without delay and no carotid bruits Chest: clear to auscultation, no signs of consolidation by percussion or palpation, normal fremitus, symmetrical and full respiratory excursions Cardiovascular: normal position and quality of the apical impulse, regular rhythm, normal first and second heart sounds, 2/6 mid peaking aortic ejection murmur, no diastolic murmurs, rubs or gallops Abdomen: no tenderness or distention, no masses by palpation, no abnormal pulsatility or arterial bruits, normal bowel sounds, no hepatosplenomegaly Extremities: no clubbing, cyanosis or edema; 2+ radial, ulnar and brachial pulses bilaterally; 2+ right femoral, posterior tibial and dorsalis  pedis pulses; 2+ left femoral, posterior tibial and dorsalis pedis pulses; no subclavian or femoral bruits Neurological: grossly nonfocal Psych: Normal mood and affect   ASSESSMENT:    1. NSVT (nonsustained ventricular tachycardia) (H[facility])   2. Paroxysmal atrial fibrillation ([place])   3. Acquired thrombophilia ([place])   4. CHB (complete heart block) ([facility])   5. Pacemaker   6. Essential hypertension   7. Nonrheumatic mitral valve stenosis   8. Nonrheumatic aortic sclerosis   9. Chronic diastolic heart failure ([place])   10. Type 2 diabetes mellitus without complication, without long-term current use of insulin ([place])   11. Hypercholesterolemia   12. Subclinical hypothyroidism     PLAN:    In order of problems listed above:  NSVT: All the episodes were asymptomatic and relatively brief, but she had a surprisingly high number of them in a relatively short period of time.  Electrolytes are okay.  Will reevaluate her echocardiogram.   Paroxysmal atrial fibrillation: Asymptomatic, infrequent, well rate controlled.  On anticoagulants Anticoagulation: So far well-tolerated without bleeding issues.  No history of falls. CHB: Paroxysmal.  Was severely symptomatic, but currently only requires roughly 11% ventricular pacing. PPM: Normal device function.  Followed by Dr. Lovena Le. HTN: Blood pressure well controlled. CHF: NYHA functional class I-II and euvolemic without the need for diuretic dose adjustment.  Stable weight at 177 pounds. MS: Her echocardiogram supports a diagnosis of at least mild mitral stenosis, but was performed during atypical hemodynamic conditions (complete heart block, bradycardia).  Reevaluate by echo. AS: Similarly, there are conflicting parameters as to the severity of the aortic stenosis, related to the unusual hemodynamic conditions when the echo was performed.  The gradients with support moderate aortic stenosis, but the calculated valve area and dimensionless index are more in  keeping with minimal aortic stenosis.  She does not have exertional angina or syncope, she has mild exertional dyspnea. DM: Good control with A1c 7% HLP: All lipid parameters in desirable range with excellent HDL.  The potential interaction between amlodipine and simvastatin is acknowledged, but has been well-tolerated and the dose of simvastatin is low.  Continue same medications. Subclinical hypothyroidism: Labs checked today show that she has a persistently elevated TSH with normal free T4.  No clinical symptoms of thyroid illness.            Medication Adjustments/Labs and Tests Ordered: Current medicines are reviewed at length with the patient today.  Concerns regarding medicines are outlined above.  Orders Placed This Encounter  Procedures   Basic  metabolic panel   Magnesium   TSH + free T4   Pacemaker external - parameters   EKG 12-Lead   ECHOCARDIOGRAM COMPLETE   No orders of the defined types were placed in this encounter.   Patient Instructions  Medication Instructions:  Continue same medications *If you need a refill on your cardiac medications before your next appointment, please call your pharmacy*   Lab Work: Bmet,magnesium,tsh,free t4 today   Testing/Procedures: Echo   Follow-Up: At [facility], you and your health needs are our priority.  As part of our continuing mission to provide you with exceptional heart care, we have created designated Provider Care Teams.  These Care Teams include your primary Cardiologist (physician) and Advanced Practice Providers (APPs -  Physician Assistants and Nurse Practitioners) who all work together to provide you with the care you need, when you need it.  We recommend signing up for the patient portal called "MyChart".  Sign up information is provided on this After Visit Summary.  MyChart is used to connect with patients for Virtual Visits (Telemedicine).  Patients are able to view lab/test results, encounter notes,  upcoming appointments, etc.  Non-urgent messages can be sent to your provider as well.   To learn more about what you can do with MyChart, go to [email or web address].    Your next appointment: 6 months   Call in March to schedule June appointment      The format for your next appointment: Office   Provider: Dr.Regla Fitzgibbon    Important Information About Sugar         Signed, Sanda Klein, MD  11/15/2022 10:29 AM    [place] does not have a paper

## 2022-11-10 NOTE — Patient Instructions (Signed)
Medication Instructions:  Continue same medications *If you need a refill on your cardiac medications before your next appointment, please call your pharmacy*   Lab Work: Bmet,magnesium,tsh,free t4 today   Testing/Procedures: Echo   Follow-Up: At [facility], you and your health needs are our priority.  As part of our continuing mission to provide you with exceptional heart care, we have created designated Provider Care Teams.  These Care Teams include your primary Cardiologist (physician) and Advanced Practice Providers (APPs -  Physician Assistants and Nurse Practitioners) who all work together to provide you with the care you need, when you need it.  We recommend signing up for the patient portal called "MyChart".  Sign up information is provided on this After Visit Summary.  MyChart is used to connect with patients for Virtual Visits (Telemedicine).  Patients are able to view lab/test results, encounter notes, upcoming appointments, etc.  Non-urgent messages can be sent to your provider as well.   To learn more about what you can do with MyChart, go to [email or web address].    Your next appointment: 6 months   Call in March to schedule June appointment      The format for your next appointment: Office   Provider: Dr.Croitoru    Important Information About Sugar

## 2022-11-11 ENCOUNTER — Other Ambulatory Visit: Payer: Self-pay

## 2022-11-11 DIAGNOSIS — R7989 Other specified abnormal findings of blood chemistry: Secondary | ICD-10-CM

## 2022-11-11 DIAGNOSIS — I10 Essential (primary) hypertension: Secondary | ICD-10-CM

## 2022-11-11 LAB — TSH+FREE T4
Free T4: 1.08 ng/dL (ref 0.82–1.77)
TSH: 7.32 u[IU]/mL — ABNORMAL HIGH (ref 0.450–4.500)

## 2022-11-11 LAB — BASIC METABOLIC PANEL
BUN/Creatinine Ratio: 21 (ref 12–28)
BUN: 22 mg/dL (ref 8–27)
CO2: 24 mmol/L (ref 20–29)
Calcium: 10.1 mg/dL (ref 8.7–10.3)
Chloride: 99 mmol/L (ref 96–106)
Creatinine, Ser: 1.05 mg/dL — ABNORMAL HIGH (ref 0.57–1.00)
Glucose: 153 mg/dL — ABNORMAL HIGH (ref 70–99)
Potassium: 4.7 mmol/L (ref 3.5–5.2)
Sodium: 139 mmol/L (ref 134–144)
eGFR: 53 mL/min/{1.73_m2} — ABNORMAL LOW (ref >59)

## 2022-11-11 LAB — MAGNESIUM: Magnesium: 2 mg/dL (ref 1.6–2.3)

## 2022-11-26 DIAGNOSIS — I5032 Chronic diastolic (congestive) heart failure: Secondary | ICD-10-CM | POA: Diagnosis not present

## 2022-11-26 DIAGNOSIS — E1169 Type 2 diabetes mellitus with other specified complication: Secondary | ICD-10-CM | POA: Diagnosis not present

## 2022-11-26 DIAGNOSIS — E782 Mixed hyperlipidemia: Secondary | ICD-10-CM | POA: Diagnosis not present

## 2022-11-26 DIAGNOSIS — I442 Atrioventricular block, complete: Secondary | ICD-10-CM | POA: Diagnosis not present

## 2022-11-26 DIAGNOSIS — Z0000 Encounter for general adult medical examination without abnormal findings: Secondary | ICD-10-CM | POA: Diagnosis not present

## 2022-11-26 DIAGNOSIS — E038 Other specified hypothyroidism: Secondary | ICD-10-CM | POA: Diagnosis not present

## 2022-11-26 DIAGNOSIS — I10 Essential (primary) hypertension: Secondary | ICD-10-CM | POA: Diagnosis not present

## 2022-11-26 DIAGNOSIS — D6869 Other thrombophilia: Secondary | ICD-10-CM | POA: Diagnosis not present

## 2022-11-26 DIAGNOSIS — I480 Paroxysmal atrial fibrillation: Secondary | ICD-10-CM | POA: Diagnosis not present

## 2022-11-26 DIAGNOSIS — Z7984 Long term (current) use of oral hypoglycemic drugs: Secondary | ICD-10-CM | POA: Diagnosis not present

## 2022-12-03 ENCOUNTER — Other Ambulatory Visit (HOSPITAL_COMMUNITY): Payer: Medicare HMO

## 2022-12-03 ENCOUNTER — Encounter (HOSPITAL_COMMUNITY): Payer: Self-pay

## 2022-12-04 ENCOUNTER — Ambulatory Visit (INDEPENDENT_AMBULATORY_CARE_PROVIDER_SITE_OTHER): Payer: Medicare HMO

## 2022-12-04 DIAGNOSIS — I442 Atrioventricular block, complete: Secondary | ICD-10-CM

## 2022-12-04 LAB — CUP PACEART REMOTE DEVICE CHECK
Battery Remaining Longevity: 172 mo
Battery Voltage: 3.16 V
Brady Statistic AP VP Percent: 0.25 %
Brady Statistic AP VS Percent: 0.23 %
Brady Statistic AS VP Percent: 0.01 %
Brady Statistic AS VS Percent: 99.52 %
Brady Statistic RA Percent Paced: 0.48 %
Brady Statistic RV Percent Paced: 0.26 %
Date Time Interrogation Session: 20231227221459
Implantable Lead Connection Status: 753985
Implantable Lead Connection Status: 753985
Implantable Lead Implant Date: 20230320
Implantable Lead Implant Date: 20230320
Implantable Lead Location: 753859
Implantable Lead Location: 753860
Implantable Lead Model: 3830
Implantable Lead Model: 5076
Implantable Pulse Generator Implant Date: 20230320
Lead Channel Impedance Value: 323 Ohm
Lead Channel Impedance Value: 437 Ohm
Lead Channel Impedance Value: 456 Ohm
Lead Channel Impedance Value: 551 Ohm
Lead Channel Pacing Threshold Amplitude: 0.5 V
Lead Channel Pacing Threshold Amplitude: 0.875 V
Lead Channel Pacing Threshold Pulse Width: 0.4 ms
Lead Channel Pacing Threshold Pulse Width: 0.4 ms
Lead Channel Sensing Intrinsic Amplitude: 14.25 mV
Lead Channel Sensing Intrinsic Amplitude: 14.25 mV
Lead Channel Sensing Intrinsic Amplitude: 5.875 mV
Lead Channel Sensing Intrinsic Amplitude: 5.875 mV
Lead Channel Setting Pacing Amplitude: 1.5 V
Lead Channel Setting Pacing Amplitude: 2 V
Lead Channel Setting Pacing Pulse Width: 0.4 ms
Lead Channel Setting Sensing Sensitivity: 0.9 mV
Zone Setting Status: 755011
Zone Setting Status: 755011

## 2022-12-15 ENCOUNTER — Encounter (HOSPITAL_COMMUNITY): Payer: Self-pay | Admitting: Cardiovascular Disease

## 2022-12-18 DIAGNOSIS — H903 Sensorineural hearing loss, bilateral: Secondary | ICD-10-CM | POA: Diagnosis not present

## 2022-12-22 NOTE — Progress Notes (Signed)
Remote pacemaker transmission.   

## 2022-12-31 DIAGNOSIS — D485 Neoplasm of uncertain behavior of skin: Secondary | ICD-10-CM | POA: Diagnosis not present

## 2022-12-31 DIAGNOSIS — L538 Other specified erythematous conditions: Secondary | ICD-10-CM | POA: Diagnosis not present

## 2022-12-31 DIAGNOSIS — L298 Other pruritus: Secondary | ICD-10-CM | POA: Diagnosis not present

## 2022-12-31 DIAGNOSIS — C44319 Basal cell carcinoma of skin of other parts of face: Secondary | ICD-10-CM | POA: Diagnosis not present

## 2022-12-31 DIAGNOSIS — Z789 Other specified health status: Secondary | ICD-10-CM | POA: Diagnosis not present

## 2022-12-31 DIAGNOSIS — L820 Inflamed seborrheic keratosis: Secondary | ICD-10-CM | POA: Diagnosis not present

## 2022-12-31 DIAGNOSIS — R208 Other disturbances of skin sensation: Secondary | ICD-10-CM | POA: Diagnosis not present

## 2023-01-05 ENCOUNTER — Encounter (INDEPENDENT_AMBULATORY_CARE_PROVIDER_SITE_OTHER): Payer: Medicare HMO | Admitting: Ophthalmology

## 2023-01-12 DIAGNOSIS — H353132 Nonexudative age-related macular degeneration, bilateral, intermediate dry stage: Secondary | ICD-10-CM | POA: Diagnosis not present

## 2023-01-12 DIAGNOSIS — H35372 Puckering of macula, left eye: Secondary | ICD-10-CM | POA: Diagnosis not present

## 2023-01-12 DIAGNOSIS — H35722 Serous detachment of retinal pigment epithelium, left eye: Secondary | ICD-10-CM | POA: Diagnosis not present

## 2023-01-12 DIAGNOSIS — C44319 Basal cell carcinoma of skin of other parts of face: Secondary | ICD-10-CM | POA: Diagnosis not present

## 2023-01-20 ENCOUNTER — Other Ambulatory Visit: Payer: Self-pay | Admitting: Internal Medicine

## 2023-01-22 DIAGNOSIS — H524 Presbyopia: Secondary | ICD-10-CM | POA: Diagnosis not present

## 2023-02-06 ENCOUNTER — Other Ambulatory Visit: Payer: Self-pay | Admitting: Student

## 2023-02-17 ENCOUNTER — Other Ambulatory Visit: Payer: Self-pay | Admitting: Internal Medicine

## 2023-02-18 MED ORDER — FUROSEMIDE 40 MG PO TABS: 40.0000 mg | ORAL_TABLET | Freq: Every day | ORAL | 2 refills | Status: DC

## 2023-02-26 DIAGNOSIS — Z789 Other specified health status: Secondary | ICD-10-CM | POA: Diagnosis not present

## 2023-02-26 DIAGNOSIS — L821 Other seborrheic keratosis: Secondary | ICD-10-CM | POA: Diagnosis not present

## 2023-02-26 DIAGNOSIS — Z08 Encounter for follow-up examination after completed treatment for malignant neoplasm: Secondary | ICD-10-CM | POA: Diagnosis not present

## 2023-02-26 DIAGNOSIS — L814 Other melanin hyperpigmentation: Secondary | ICD-10-CM | POA: Diagnosis not present

## 2023-02-26 DIAGNOSIS — L298 Other pruritus: Secondary | ICD-10-CM | POA: Diagnosis not present

## 2023-02-26 DIAGNOSIS — D1801 Hemangioma of skin and subcutaneous tissue: Secondary | ICD-10-CM | POA: Diagnosis not present

## 2023-02-26 DIAGNOSIS — Z85828 Personal history of other malignant neoplasm of skin: Secondary | ICD-10-CM | POA: Diagnosis not present

## 2023-02-26 DIAGNOSIS — C44729 Squamous cell carcinoma of skin of left lower limb, including hip: Secondary | ICD-10-CM | POA: Diagnosis not present

## 2023-02-26 DIAGNOSIS — L538 Other specified erythematous conditions: Secondary | ICD-10-CM | POA: Diagnosis not present

## 2023-02-26 DIAGNOSIS — L820 Inflamed seborrheic keratosis: Secondary | ICD-10-CM | POA: Diagnosis not present

## 2023-03-05 ENCOUNTER — Ambulatory Visit (INDEPENDENT_AMBULATORY_CARE_PROVIDER_SITE_OTHER): Payer: Medicare HMO

## 2023-03-05 DIAGNOSIS — I442 Atrioventricular block, complete: Secondary | ICD-10-CM

## 2023-03-05 DIAGNOSIS — D0472 Carcinoma in situ of skin of left lower limb, including hip: Secondary | ICD-10-CM | POA: Diagnosis not present

## 2023-03-05 LAB — CUP PACEART REMOTE DEVICE CHECK
Battery Remaining Longevity: 170 mo
Battery Voltage: 3.12 V
Brady Statistic AP VP Percent: 0.46 %
Brady Statistic AP VS Percent: 0.38 %
Brady Statistic AS VP Percent: 0.25 %
Brady Statistic AS VS Percent: 98.92 %
Brady Statistic RA Percent Paced: 0.87 %
Brady Statistic RV Percent Paced: 0.71 %
Date Time Interrogation Session: 20240327225409
Implantable Lead Connection Status: 753985
Implantable Lead Connection Status: 753985
Implantable Lead Implant Date: 20230320
Implantable Lead Implant Date: 20230320
Implantable Lead Location: 753859
Implantable Lead Location: 753860
Implantable Lead Model: 3830
Implantable Lead Model: 5076
Implantable Pulse Generator Implant Date: 20230320
Lead Channel Impedance Value: 304 Ohm
Lead Channel Impedance Value: 437 Ohm
Lead Channel Impedance Value: 475 Ohm
Lead Channel Impedance Value: 551 Ohm
Lead Channel Pacing Threshold Amplitude: 0.375 V
Lead Channel Pacing Threshold Amplitude: 0.875 V
Lead Channel Pacing Threshold Pulse Width: 0.4 ms
Lead Channel Pacing Threshold Pulse Width: 0.4 ms
Lead Channel Sensing Intrinsic Amplitude: 16.625 mV
Lead Channel Sensing Intrinsic Amplitude: 16.625 mV
Lead Channel Sensing Intrinsic Amplitude: 6 mV
Lead Channel Sensing Intrinsic Amplitude: 6 mV
Lead Channel Setting Pacing Amplitude: 1.5 V
Lead Channel Setting Pacing Amplitude: 2 V
Lead Channel Setting Pacing Pulse Width: 0.4 ms
Lead Channel Setting Sensing Sensitivity: 0.9 mV
Zone Setting Status: 755011
Zone Setting Status: 755011

## 2023-04-07 ENCOUNTER — Other Ambulatory Visit: Payer: Self-pay | Admitting: Cardiovascular Disease

## 2023-04-08 NOTE — Progress Notes (Signed)
Remote pacemaker transmission.   

## 2023-04-23 DIAGNOSIS — M8588 Other specified disorders of bone density and structure, other site: Secondary | ICD-10-CM | POA: Diagnosis not present

## 2023-04-23 DIAGNOSIS — E782 Mixed hyperlipidemia: Secondary | ICD-10-CM | POA: Diagnosis not present

## 2023-04-23 DIAGNOSIS — I5032 Chronic diastolic (congestive) heart failure: Secondary | ICD-10-CM | POA: Diagnosis not present

## 2023-04-23 DIAGNOSIS — E1169 Type 2 diabetes mellitus with other specified complication: Secondary | ICD-10-CM | POA: Diagnosis not present

## 2023-04-23 DIAGNOSIS — I480 Paroxysmal atrial fibrillation: Secondary | ICD-10-CM | POA: Diagnosis not present

## 2023-04-23 DIAGNOSIS — I10 Essential (primary) hypertension: Secondary | ICD-10-CM | POA: Diagnosis not present

## 2023-04-25 IMAGING — DX DG CHEST 2V
2 series · 3 of 3 positions shown · non-contrast
Comparison: None.

CLINICAL DATA: Dyspnea on exertion.

EXAM:
CHEST - 2 VIEW

[Series 2: chest lat · 0.14mm/px · 2 of 2 slices shown]
[im 1/2]
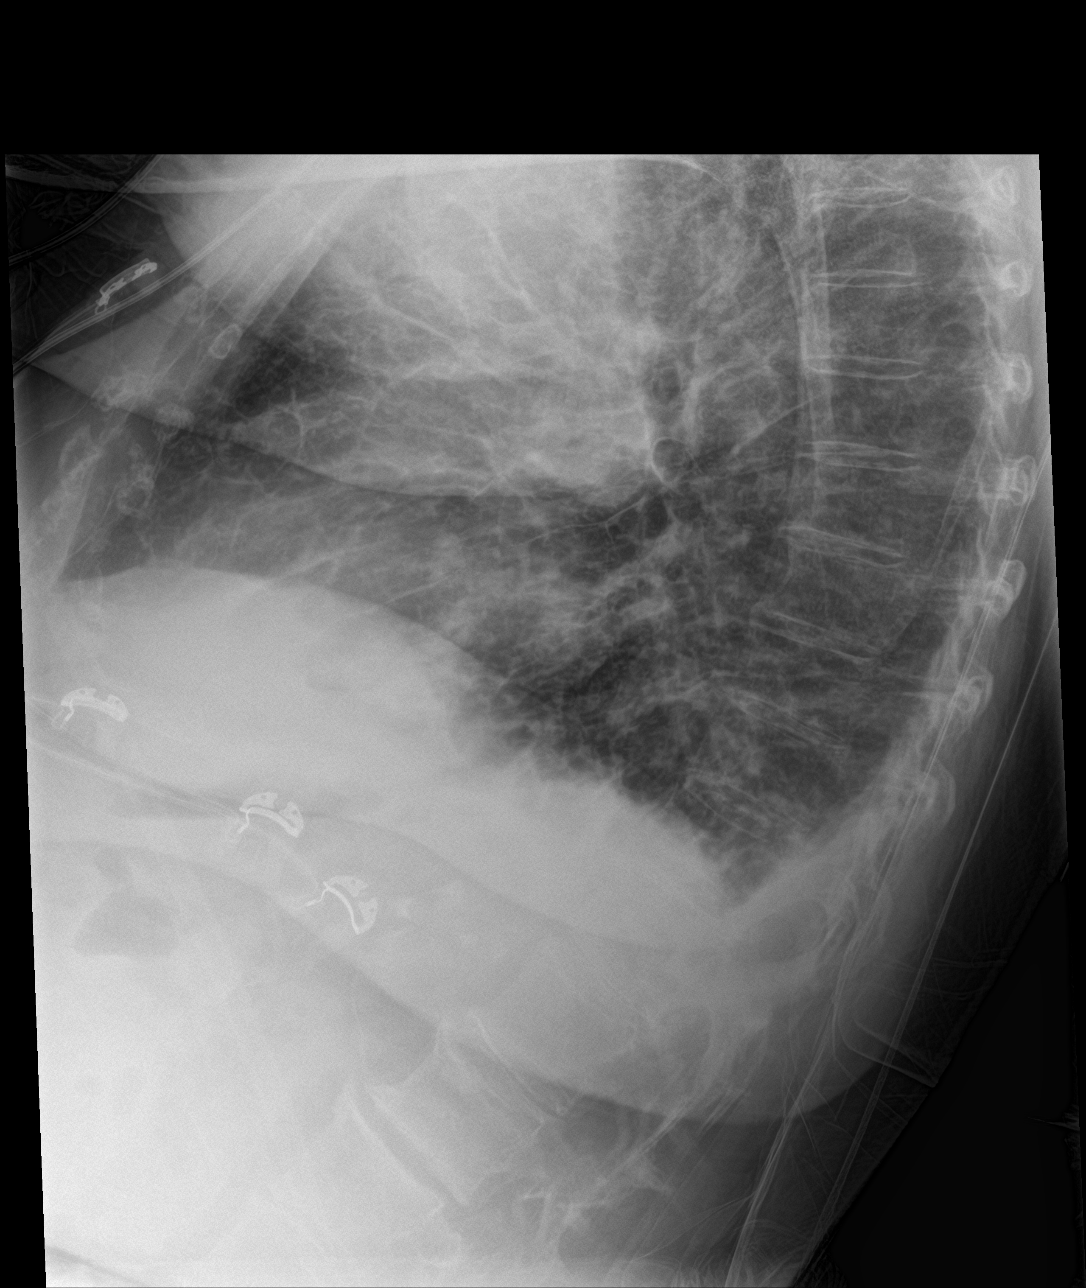
[im 2/2]
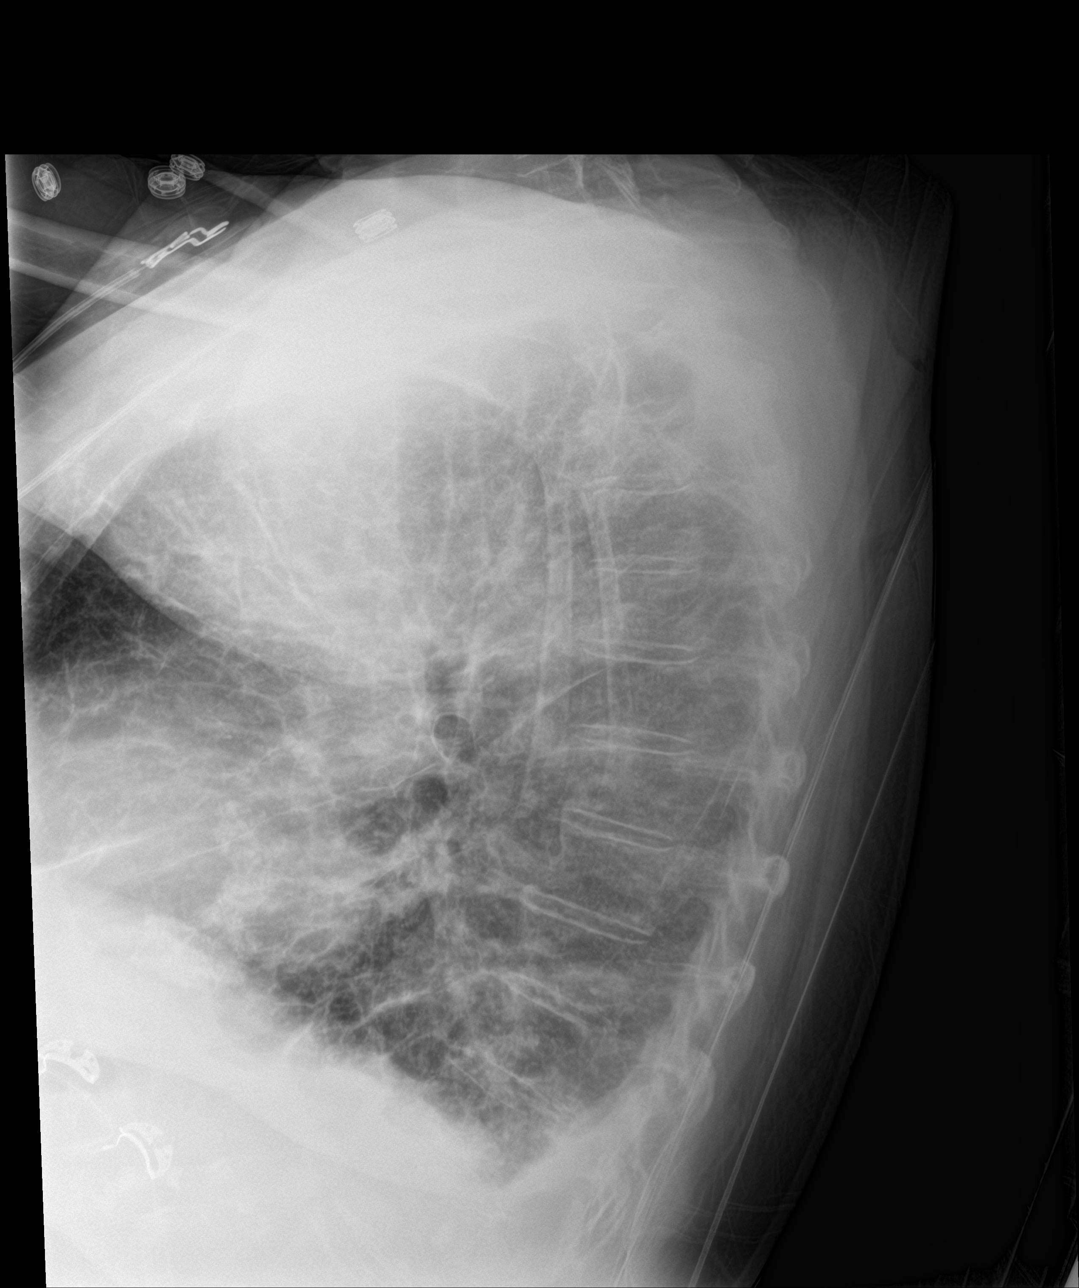

[chest ap]
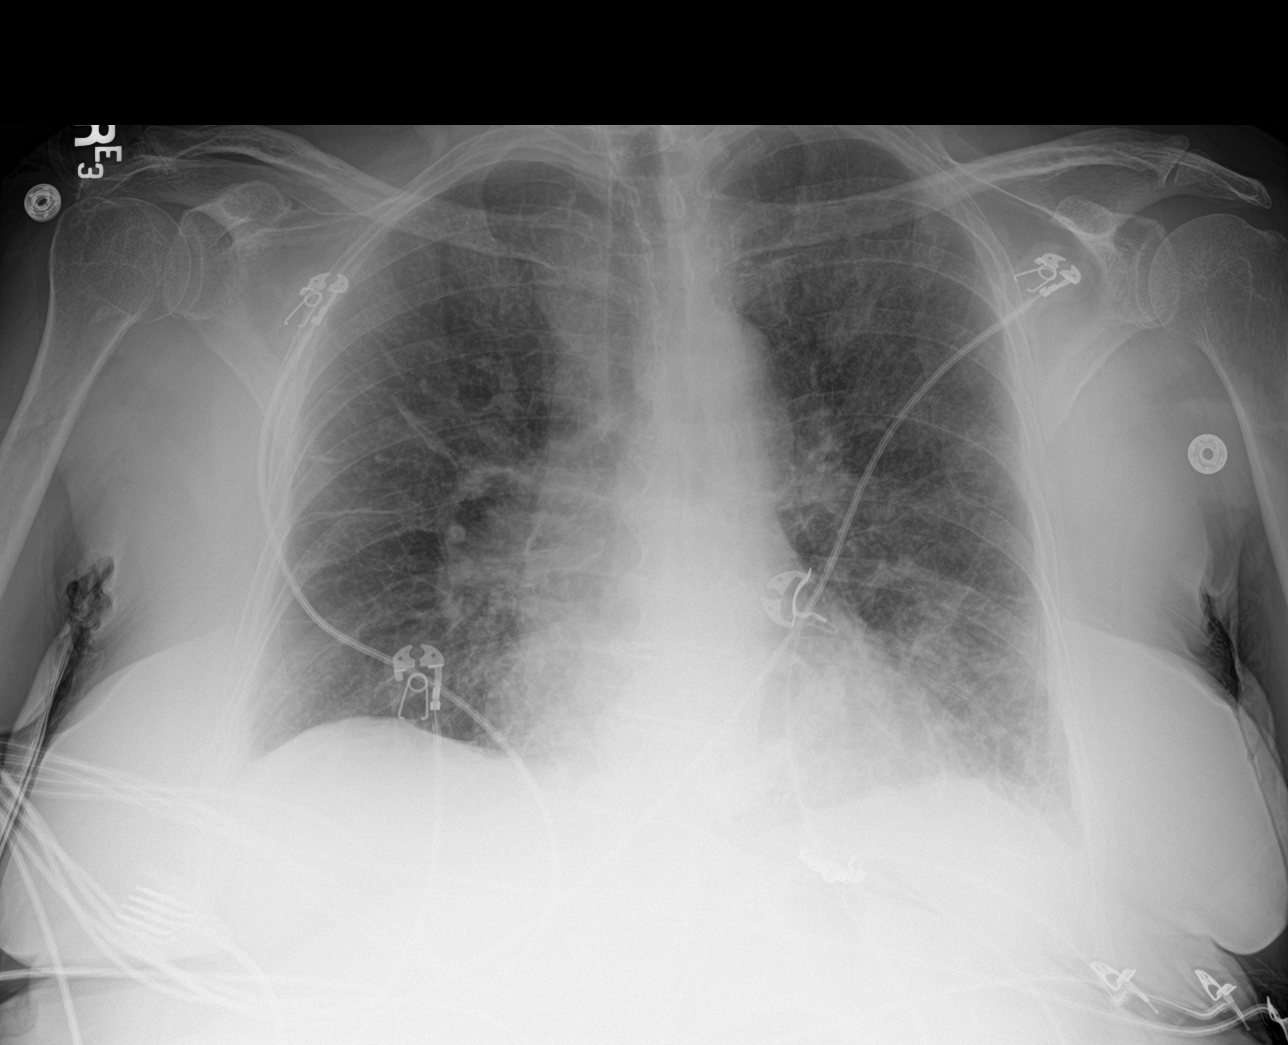

[3 of 3 positions shown; findings below may reference images not displayed]

FINDINGS: Lungs are mildly hyperexpanded. Interstitial markings are diffusely
coarsened with chronic features. Left base atelectasis/infiltrate
noted. Streaky opacity in the parahilar right lung suggests
atelectasis or scarring possible tiny pleural effusions. Bones are
demineralized. Telemetry leads overlie the chest.
IMPRESSION: 1. Left base atelectasis/infiltrate.
2. Streaky opacity in the parahilar right lung suggests atelectasis
or scarring.
3. Interstitial prominence diffusely in both lungs may be chronic
but component of interstitial edema not excluded.

## 2023-04-28 IMAGING — CR DG CHEST 2V
2 series · 2 of 2 positions shown · non-contrast
Comparison: Previous studies including the examination of
02/22/2022

CLINICAL DATA: Shortness of breath

EXAM:
CHEST - 2 VIEW

[chest pa]
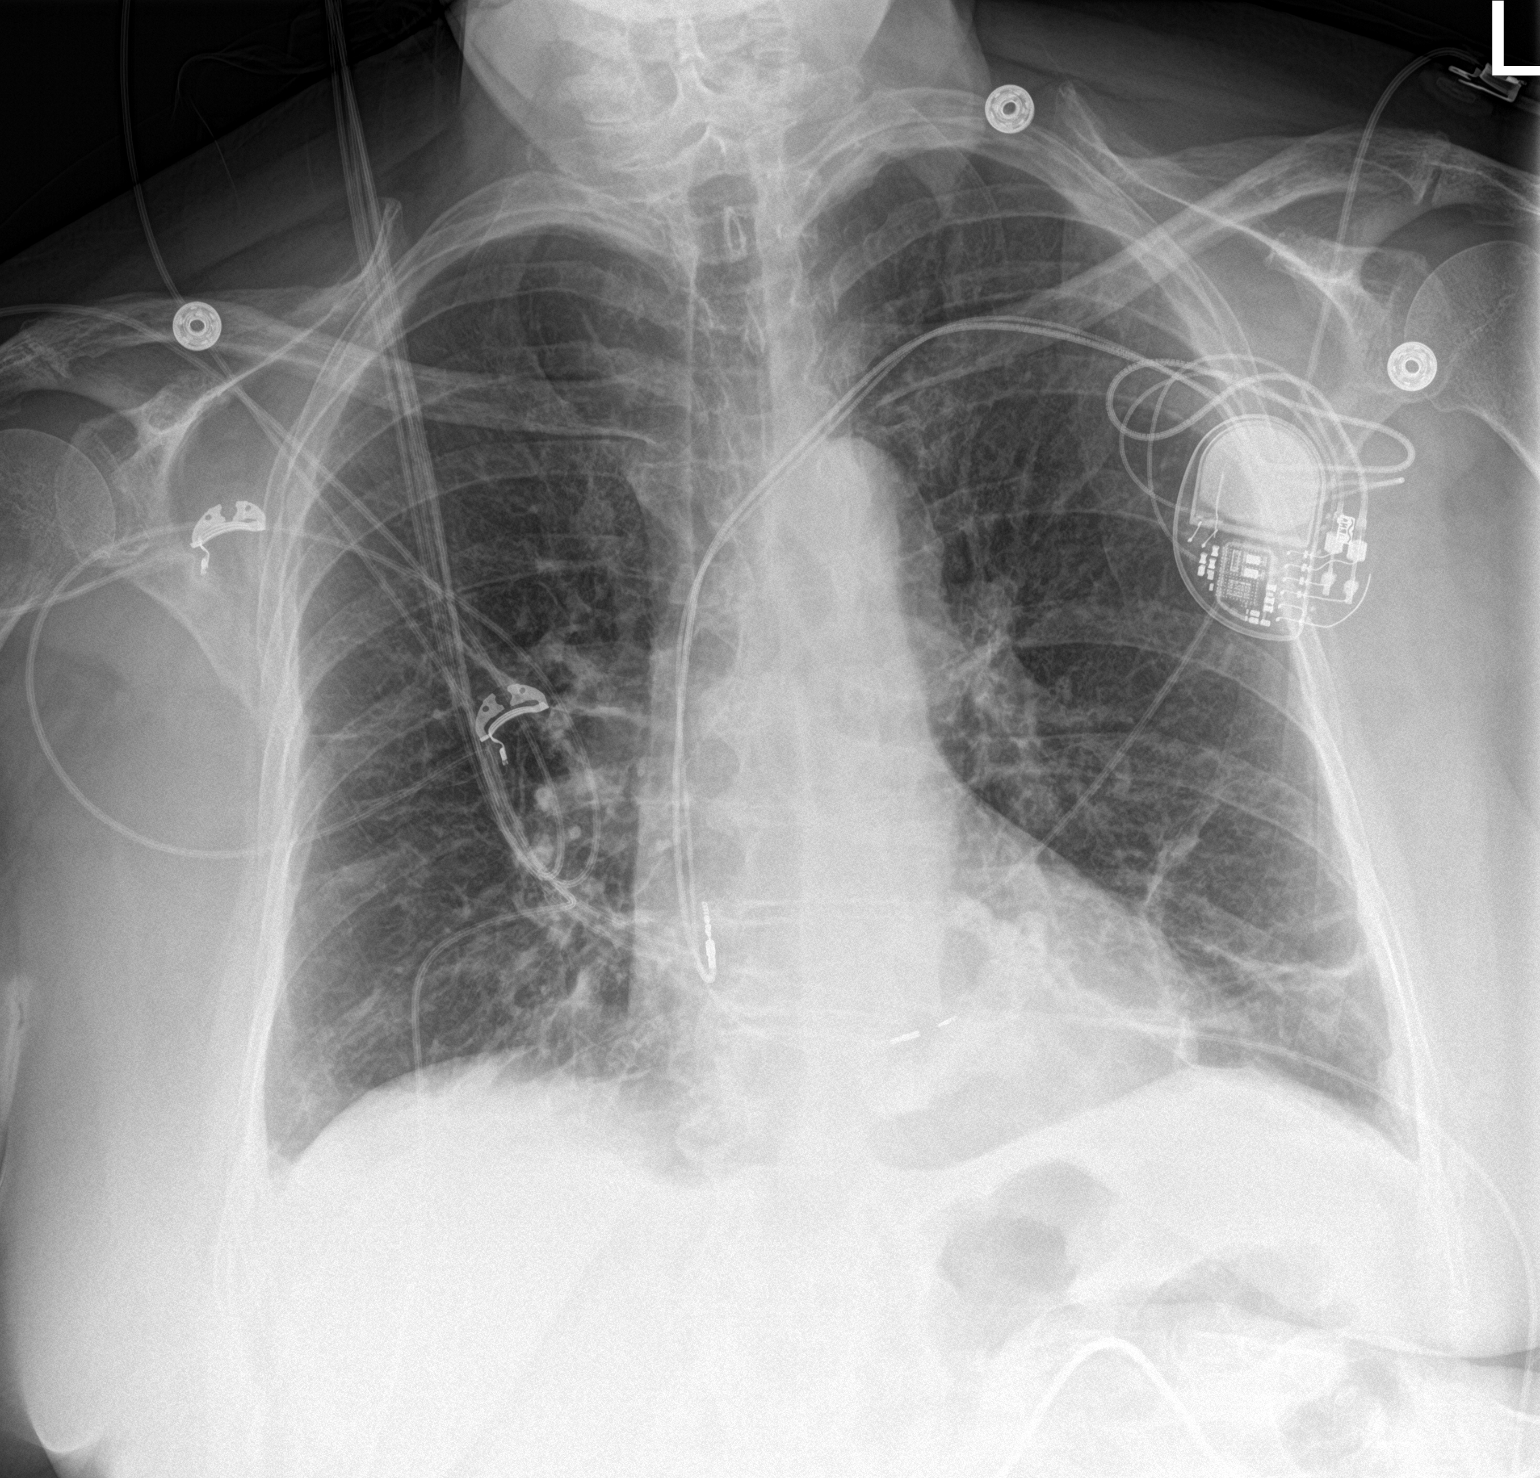

[chest lat]
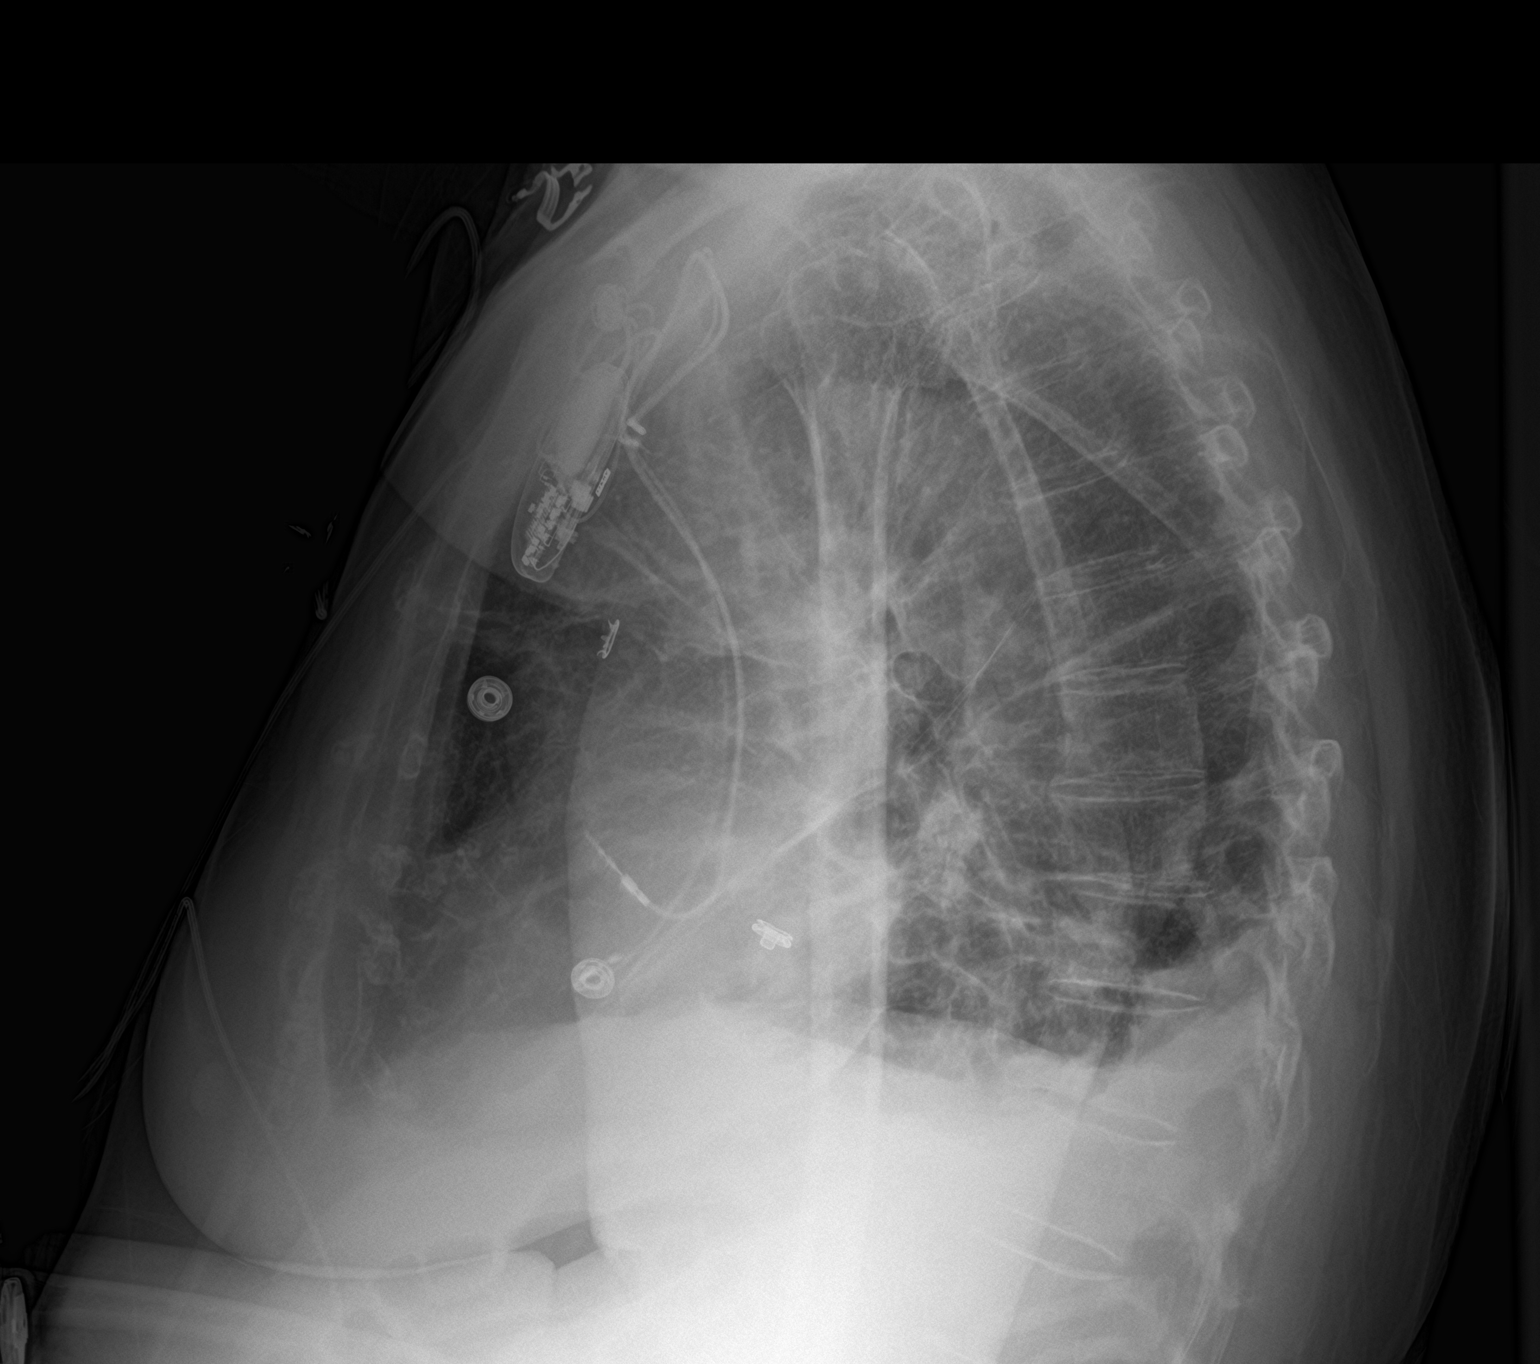

[2 of 2 positions shown; findings below may reference images not displayed]

FINDINGS: Cardiac size is within normal limits. Dense calcification is seen in
the mitral annulus. Pacemaker battery is seen in the left
infraclavicular region. There is interval decrease in interstitial
markings suggesting resolution of interstitial pulmonary edema.
There are linear densities in the left lower lung fields suggesting
subsegmental atelectasis. There is blunting of right lateral CP
angle. There is no pneumothorax.
IMPRESSION: There is interval decrease in interstitial markings in both lungs
suggesting resolution of interstitial pulmonary edema. There are
linear densities in the left lower lung fields suggesting
subsegmental atelectasis. Possible small right pleural effusion.

## 2023-05-04 ENCOUNTER — Other Ambulatory Visit: Payer: Self-pay | Admitting: Internal Medicine

## 2023-05-08 ENCOUNTER — Other Ambulatory Visit: Payer: Self-pay | Admitting: Cardiovascular Disease

## 2023-05-28 DIAGNOSIS — D6869 Other thrombophilia: Secondary | ICD-10-CM | POA: Diagnosis not present

## 2023-05-28 DIAGNOSIS — I110 Hypertensive heart disease with heart failure: Secondary | ICD-10-CM | POA: Diagnosis not present

## 2023-05-28 DIAGNOSIS — I10 Essential (primary) hypertension: Secondary | ICD-10-CM | POA: Diagnosis not present

## 2023-05-28 DIAGNOSIS — I480 Paroxysmal atrial fibrillation: Secondary | ICD-10-CM | POA: Diagnosis not present

## 2023-05-28 DIAGNOSIS — Z23 Encounter for immunization: Secondary | ICD-10-CM | POA: Diagnosis not present

## 2023-05-28 DIAGNOSIS — E038 Other specified hypothyroidism: Secondary | ICD-10-CM | POA: Diagnosis not present

## 2023-05-28 DIAGNOSIS — E119 Type 2 diabetes mellitus without complications: Secondary | ICD-10-CM | POA: Diagnosis not present

## 2023-05-28 DIAGNOSIS — E1169 Type 2 diabetes mellitus with other specified complication: Secondary | ICD-10-CM | POA: Diagnosis not present

## 2023-05-28 DIAGNOSIS — E782 Mixed hyperlipidemia: Secondary | ICD-10-CM | POA: Diagnosis not present

## 2023-05-28 DIAGNOSIS — I5032 Chronic diastolic (congestive) heart failure: Secondary | ICD-10-CM | POA: Diagnosis not present

## 2023-06-01 ENCOUNTER — Other Ambulatory Visit: Payer: Self-pay | Admitting: Internal Medicine

## 2023-06-01 ENCOUNTER — Encounter: Payer: Self-pay | Admitting: Cardiovascular Disease

## 2023-06-01 ENCOUNTER — Other Ambulatory Visit: Payer: Self-pay

## 2023-06-01 MED ORDER — METOPROLOL SUCCINATE ER 25 MG PO TB24: 25.0000 mg | ORAL_TABLET | Freq: Every day | ORAL | 0 refills | Status: DC

## 2023-06-03 ENCOUNTER — Ambulatory Visit (HOSPITAL_COMMUNITY): Payer: Medicare HMO | Attending: Internal Medicine

## 2023-06-03 DIAGNOSIS — I442 Atrioventricular block, complete: Secondary | ICD-10-CM | POA: Diagnosis not present

## 2023-06-03 DIAGNOSIS — I10 Essential (primary) hypertension: Secondary | ICD-10-CM | POA: Insufficient documentation

## 2023-06-03 DIAGNOSIS — E7800 Pure hypercholesterolemia, unspecified: Secondary | ICD-10-CM | POA: Insufficient documentation

## 2023-06-03 DIAGNOSIS — Z950 Presence of cardiac pacemaker: Secondary | ICD-10-CM | POA: Diagnosis not present

## 2023-06-03 DIAGNOSIS — I5032 Chronic diastolic (congestive) heart failure: Secondary | ICD-10-CM | POA: Insufficient documentation

## 2023-06-03 DIAGNOSIS — R0609 Other forms of dyspnea: Secondary | ICD-10-CM | POA: Diagnosis not present

## 2023-06-03 DIAGNOSIS — I480 Paroxysmal atrial fibrillation: Secondary | ICD-10-CM | POA: Insufficient documentation

## 2023-06-03 DIAGNOSIS — D6869 Other thrombophilia: Secondary | ICD-10-CM | POA: Diagnosis not present

## 2023-06-03 DIAGNOSIS — E119 Type 2 diabetes mellitus without complications: Secondary | ICD-10-CM | POA: Insufficient documentation

## 2023-06-03 LAB — ECHOCARDIOGRAM COMPLETE
Area-P 1/2: 2.11 cm2
MV M vel: 5.27 m/s
MV Peak grad: 111.1 mmHg
MV VTI: 1.81 cm2
P 1/2 time: 486 msec
S' Lateral: 3.4 cm

## 2023-06-04 ENCOUNTER — Telehealth: Payer: Self-pay | Admitting: Emergency Medicine

## 2023-06-04 ENCOUNTER — Ambulatory Visit (INDEPENDENT_AMBULATORY_CARE_PROVIDER_SITE_OTHER): Payer: Medicare HMO

## 2023-06-04 DIAGNOSIS — I442 Atrioventricular block, complete: Secondary | ICD-10-CM

## 2023-06-04 NOTE — Telephone Encounter (Signed)
Reviewed her echocardiogram and I think that there is some evidence of too much fluid on board.  Having any shortness of breath?  Any swelling in her legs?   Called pt to go over results and recommendations; she has not viewed it over mychart yet. Dr C wanted to know if she is having any sob or swelling. No answer, left message with call back number

## 2023-06-05 LAB — CUP PACEART REMOTE DEVICE CHECK
Battery Remaining Longevity: 165 mo
Battery Voltage: 3.08 V
Brady Statistic AP VP Percent: 2.07 %
Brady Statistic AP VS Percent: 0.65 %
Brady Statistic AS VP Percent: 1.74 %
Brady Statistic AS VS Percent: 95.54 %
Brady Statistic RA Percent Paced: 2.73 %
Brady Statistic RV Percent Paced: 3.81 %
Date Time Interrogation Session: 20240627211740
Implantable Lead Connection Status: 753985
Implantable Lead Connection Status: 753985
Implantable Lead Implant Date: 20230320
Implantable Lead Implant Date: 20230320
Implantable Lead Location: 753859
Implantable Lead Location: 753860
Implantable Lead Model: 3830
Implantable Lead Model: 5076
Implantable Pulse Generator Implant Date: 20230320
Lead Channel Impedance Value: 304 Ohm
Lead Channel Impedance Value: 437 Ohm
Lead Channel Impedance Value: 494 Ohm
Lead Channel Impedance Value: 570 Ohm
Lead Channel Pacing Threshold Amplitude: 0.375 V
Lead Channel Pacing Threshold Amplitude: 1.125 V
Lead Channel Pacing Threshold Pulse Width: 0.4 ms
Lead Channel Pacing Threshold Pulse Width: 0.4 ms
Lead Channel Sensing Intrinsic Amplitude: 15.5 mV
Lead Channel Sensing Intrinsic Amplitude: 15.5 mV
Lead Channel Sensing Intrinsic Amplitude: 5.5 mV
Lead Channel Sensing Intrinsic Amplitude: 5.5 mV
Lead Channel Setting Pacing Amplitude: 1.5 V
Lead Channel Setting Pacing Amplitude: 2.5 V
Lead Channel Setting Pacing Pulse Width: 0.4 ms
Lead Channel Setting Sensing Sensitivity: 0.9 mV
Zone Setting Status: 755011
Zone Setting Status: 755011

## 2023-06-22 ENCOUNTER — Other Ambulatory Visit: Payer: Self-pay | Admitting: Cardiovascular Disease

## 2023-06-22 DIAGNOSIS — I480 Paroxysmal atrial fibrillation: Secondary | ICD-10-CM

## 2023-06-22 NOTE — Telephone Encounter (Signed)
Prescription refill request for Eliquis received. Indication: Afib  Last office visit: 11/10/22 (Croitoru)  Scr: 1.05 (11/10/22)  Age: 82 Weight: 80.3kg  Appropriate dose. Refill sent.

## 2023-06-23 NOTE — Progress Notes (Signed)
 Remote pacemaker transmission.   

## 2023-06-25 ENCOUNTER — Encounter: Payer: Self-pay | Admitting: Internal Medicine

## 2023-06-25 ENCOUNTER — Ambulatory Visit: Payer: Medicare HMO | Attending: Internal Medicine | Admitting: Internal Medicine

## 2023-06-25 VITALS — BP 126/64 | HR 83 | Ht 65.0 in | Wt 176.0 lb

## 2023-06-25 DIAGNOSIS — I442 Atrioventricular block, complete: Secondary | ICD-10-CM

## 2023-06-25 DIAGNOSIS — Z950 Presence of cardiac pacemaker: Secondary | ICD-10-CM

## 2023-06-25 LAB — CUP PACEART INCLINIC DEVICE CHECK
Battery Remaining Longevity: 165 mo
Battery Voltage: 3.08 V
Brady Statistic AP VP Percent: 1.23 %
Brady Statistic AP VS Percent: 0.49 %
Brady Statistic AS VP Percent: 1.32 %
Brady Statistic AS VS Percent: 96.96 %
Brady Statistic RA Percent Paced: 1.74 %
Brady Statistic RV Percent Paced: 2.55 %
Date Time Interrogation Session: 20240718151221
Implantable Lead Connection Status: 753985
Implantable Lead Connection Status: 753985
Implantable Lead Implant Date: 20230320
Implantable Lead Implant Date: 20230320
Implantable Lead Location: 753859
Implantable Lead Location: 753860
Implantable Lead Model: 3830
Implantable Lead Model: 5076
Implantable Pulse Generator Implant Date: 20230320
Lead Channel Impedance Value: 323 Ohm
Lead Channel Impedance Value: 456 Ohm
Lead Channel Impedance Value: 513 Ohm
Lead Channel Impedance Value: 608 Ohm
Lead Channel Pacing Threshold Amplitude: 0.375 V
Lead Channel Pacing Threshold Amplitude: 0.5 V
Lead Channel Pacing Threshold Amplitude: 1.25 V
Lead Channel Pacing Threshold Pulse Width: 0.4 ms
Lead Channel Pacing Threshold Pulse Width: 0.4 ms
Lead Channel Pacing Threshold Pulse Width: 0.4 ms
Lead Channel Sensing Intrinsic Amplitude: 15.25 mV
Lead Channel Sensing Intrinsic Amplitude: 21 mV
Lead Channel Sensing Intrinsic Amplitude: 4.25 mV
Lead Channel Sensing Intrinsic Amplitude: 6.625 mV
Lead Channel Setting Pacing Amplitude: 1.5 V
Lead Channel Setting Pacing Amplitude: 2.5 V
Lead Channel Setting Pacing Pulse Width: 0.4 ms
Lead Channel Setting Sensing Sensitivity: 0.9 mV
Zone Setting Status: 755011
Zone Setting Status: 755011

## 2023-06-25 NOTE — Progress Notes (Signed)
HPI  Sheri Moreno is a 82 y.o. female with a hx of HTN and sleep apnea was seen 02/2022 for the evaluation of CHB and underwent PPM insertion. In the interim she notes she is still working and feels well. She denies chest pain or sob. No edema. She feels well.     Current Outpatient Medications  Medication Sig Dispense Refill   amLODipine (NORVASC) 10 MG tablet Take 10 mg by mouth daily.     apixaban (ELIQUIS) 5 MG TABS tablet TAKE 1 TABLET BY MOUTH TWICE A DAY 60 tablet 5   Calcium Carb-Cholecalciferol 600-10 MG-MCG TABS Take 1 tablet by mouth daily.     furosemide (LASIX) 40 MG tablet TAKE 1 TABLET (40 MG TOTAL) BY MOUTH DAILY. TAKE 80 MG ONCE DAILY FOR A WEIGHT OVER 177 LBS 180 tablet 0   glipiZIDE (GLUCOTROL) 10 MG tablet Take 10 mg by mouth daily before breakfast.     metFORMIN (GLUCOPHAGE-XR) 500 MG 24 hr tablet Take 1,000 mg by mouth daily with breakfast.     metoprolol succinate (TOPROL-XL) 25 MG 24 hr tablet Take 1 tablet (25 mg total) by mouth daily. 15 tablet 0   Misc Natural Products (OSTEO BI-FLEX ADV JOINT SHIELD PO) Take 1 tablet by mouth daily.       Omega-3 1000 MG CAPS Take 1,000 mg by mouth daily in the afternoon.     potassium chloride SA (KLOR-CON M20) 20 MEQ tablet TAKE 2 TABLETS BY MOUTH DAILY 180 tablet 0   simvastatin (ZOCOR) 20 MG tablet Take 20 mg by mouth daily.     No current facility-administered medications for this visit.     Past Medical History:  Diagnosis Date   Cataract    Diabetes mellitus without complication (HCC)    Hypertension    Obesity     ROS:   All systems reviewed and negative except as noted in the HPI.   Past Surgical History:  Procedure Laterality Date   CATARACT EXTRACTION     PACEMAKER IMPLANT N/A 02/24/2022   Procedure: PACEMAKER IMPLANT;  Surgeon: Marinus Maw, MD;  Location: [facility];  Service: Cardiovascular;  Laterality: N/A;   TONSILLECTOMY       Family History  Problem Relation Age of Onset    Pancreatic cancer Sister    Stomach cancer Father    Hypertension Other    Lung cancer Maternal Aunt      Social History   Socioeconomic History   Marital status: Single    Spouse name: Not on file   Number of children: Not on file   Years of education: Not on file   Highest education level: Not on file  Occupational History   Occupation: Radio producer  Tobacco Use   Smoking status: Never   Smokeless tobacco: Never  Substance and Sexual Activity   Alcohol use: Not Currently    Comment: rarely   Drug use: Not on file   Sexual activity: Not on file  Other Topics Concern   Not on file  Social History Narrative   ** Merged History Encounter **       Social Determinants of Health   Financial Resource Strain: Not on file  Food Insecurity: Not on file  Transportation Needs: Not on file  Physical Activity: Not on file  Stress: Not on file  Social Connections: Not on file  Intimate Partner Violence: Not on file     BP 126/64   Pulse  83   Ht 5\' 5"  (1.651 m)   Wt 176 lb (79.8 kg)   SpO2 97%   BMI 29.29 kg/m   Physical Exam:  Well appearing NAD HEENT: Unremarkable Neck:  No JVD, no thyromegally Lymphatics:  No adenopathy Back:  No CVA tenderness Lungs:  Clear with no wheezes HEART:  Regular rate rhythm, no murmurs, no rubs, no clicks Abd:  soft, positive bowel sounds, no organomegally, no rebound, no guarding Ext:  2 plus pulses, no edema, no cyanosis, no clubbing Skin:  No rashes no nodules Neuro:  CN II through XII intact, motor grossly intact  EKG - nsr with ventricular paing  DEVICE  Normal device function.  See PaceArt for details.   Assess/Plan:  Incomplete CHB - she is conducting today. She denies symptoms. PPM - her medtronic DDD PM is working normally. We will recheck in several months. HTN - her bp is working normally.  Dyspnea - she has improved since PPM insertion. She will maintain a low sodium diet.   Sheri Gowda Payeton Germani,MD

## 2023-06-25 NOTE — Patient Instructions (Signed)
Medication Instructions:  Your physician recommends that you continue on your current medications as directed. Please refer to the Current Medication list given to you today.  *If you need a refill on your cardiac medications before your next appointment, please call your pharmacy*   Lab Work: None ordered.  If you have labs (blood work) drawn today and your tests are completely normal, you will receive your results only by: MyChart Message (if you have MyChart) OR A paper copy in the mail If you have any lab test that is abnormal or we need to change your treatment, we will call you to review the results.   Testing/Procedures: None ordered.    Follow-Up: At [facility], you and your health needs are our priority.  As part of our continuing mission to provide you with exceptional heart care, we have created designated Provider Care Teams.  These Care Teams include your primary Cardiologist (physician) and Advanced Practice Providers (APPs -  Physician Assistants and Nurse Practitioners) who all work together to provide you with the care you need, when you need it.  We recommend signing up for the patient portal called "MyChart".  Sign up information is provided on this After Visit Summary.  MyChart is used to connect with patients for Virtual Visits (Telemedicine).  Patients are able to view lab/test results, encounter notes, upcoming appointments, etc.  Non-urgent messages can be sent to your provider as well.   To learn more about what you can do with MyChart, go to [email or web address].    Your next appointment:   12 months with Dr Ladona Ridgel

## 2023-06-27 ENCOUNTER — Other Ambulatory Visit: Payer: Self-pay | Admitting: Internal Medicine

## 2023-07-09 ENCOUNTER — Other Ambulatory Visit: Payer: Self-pay | Admitting: Cardiovascular Disease

## 2023-07-09 MED ORDER — POTASSIUM CHLORIDE CRYS ER 20 MEQ PO TBCR: 40.0000 meq | EXTENDED_RELEASE_TABLET | Freq: Every day | ORAL | 1 refills | Status: DC

## 2023-07-12 NOTE — Progress Notes (Unsigned)
Cardiology Clinic Note   Patient Name: Sheri Moreno Date of Encounter: 07/13/2023  Primary Care Provider:  Joycelyn Rua, MD Primary Cardiologist:  Dr.Croitoru   Patient Profile    82 year old female with history of hypertension, chronic diastolic heart failure, OSA, Type 2 diabetes, dual-chamber permanent pacemaker placed by Dr. 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    When seen last by Dr. Royann Shivers on 11/10/2022 she was started on Eliquis 5 mg twice daily after discussion of risks and benefits, with risk of embolic stroke higher than bleeding.  She was to continue to follow with Dr. Ladona Ridgel for pacemaker interrogation.  On last office visit echocardiogram was ordered to evaluate mild mitral valve stenosis.  Past Medical History    Past Medical History:  Diagnosis Date   Cataract    Diabetes mellitus without complication (HCC)    Hypertension    Obesity    Past Surgical History:  Procedure Laterality Date   CATARACT EXTRACTION     PACEMAKER IMPLANT N/A 02/24/2022   Procedure: PACEMAKER IMPLANT;  Surgeon: Marinus Maw, MD;  Location: [facility];  Service: Cardiovascular;  Laterality: N/A;   TONSILLECTOMY      Allergies  Allergies  Allergen Reactions   Ace Inhibitors Cough   Clindamycin Hcl Rash   Ethyl Chloride Rash   Penicillins Hives and Rash    History of Present Illness    Sheri Moreno returns today for ongoing assessment and management of paroxysmal atrial fibrillation on Eliquis, hypertension, chronic diastolic heart failure, and hyperlipidemia.  She continues to follow with Dr. Ladona Ridgel for interrogation of permanent pacemaker.  Echocardiogram dated 06/03/2023, Dr. Royann Shivers reported that there was evidence of volume overload.  LVEF was 60 to 65%, there was mildly elevated pulmonary artery  systolic pressurewith normal RV systolic function.  The mitral valve was abnormal with mild to moderate mitral valve regurgitation and mild mitral stenosis with a valve gradient of 5.5 mmHg with severe annular calcification.    On follow-up with the patient by phone to discuss echo results, the patient reported that she had some lower extremity edema but no shortness of breath.  She was encouraged to talk with her PCP about [place] or [place] and for kidney protection.   She continues to do fairly well on follow-up.  She states that she puts in 6000 steps a day, would like to do some mild upper arm strengthening with weights.  She does have trouble climbing stairs as she states by the time she gets to the second flight she has to stop and breathe.  She denies any chest pain, palpitations, or dizziness associated with it.    Home Medications    Current Outpatient Medications  Medication Sig Dispense Refill   amLODipine (NORVASC) 10 MG tablet Take 10 mg by mouth daily.     Calcium Carb-Cholecalciferol 600-10 MG-MCG TABS Take 1 tablet by mouth daily.     furosemide (LASIX) 40 MG tablet TAKE 1 TABLET (40 MG TOTAL) BY MOUTH DAILY. TAKE 80 MG ONCE DAILY FOR A WEIGHT OVER 177 LBS 180 tablet 0   glipiZIDE (GLUCOTROL) 10 MG tablet Take 10 mg by mouth daily before breakfast.     metFORMIN (GLUCOPHAGE-XR) 500 MG 24 hr tablet Take 1,000 mg by mouth daily with breakfast.     metoprolol succinate (TOPROL-XL) 25 MG 24 hr tablet TAKE 1  TABLET (25 MG TOTAL) BY MOUTH DAILY. 90 tablet 3   Misc Natural Products (OSTEO BI-FLEX ADV JOINT SHIELD PO) Take 1 tablet by mouth daily.       potassium chloride SA (KLOR-CON M20) 20 MEQ tablet Take 2 tablets (40 mEq total) by mouth daily. 180 tablet 1   simvastatin (ZOCOR) 20 MG tablet Take 20 mg by mouth daily.     apixaban (ELIQUIS) 5 MG TABS tablet Take 1 tablet (5 mg total) by mouth 2 (two) times daily. 180 tablet 3   No current facility-administered medications for this  visit.     Family History    Family History  Problem Relation Age of Onset   Pancreatic cancer Sister    Stomach cancer Father    Hypertension Other    Lung cancer Maternal Aunt    She indicated that the status of her father is unknown. She indicated that the status of her sister is unknown. She indicated that the status of her maternal aunt is unknown. She indicated that the status of her other is unknown.  Social History    Social History   Socioeconomic History   Marital status: Single    Spouse name: Not on file   Number of children: Not on file   Years of education: Not on file   Highest education level: Not on file  Occupational History   Occupation: Radio producer  Tobacco Use   Smoking status: Never   Smokeless tobacco: Never  Substance and Sexual Activity   Alcohol use: Not Currently    Comment: rarely   Drug use: Not on file   Sexual activity: Not on file  Other Topics Concern   Not on file  Social History Narrative   ** Merged History Encounter **       Social Determinants of Health   Financial Resource Strain: Not on file  Food Insecurity: Not on file  Transportation Needs: Not on file  Physical Activity: Not on file  Stress: Not on file  Social Connections: Not on file  Intimate Partner Violence: Not on file     Review of Systems    General:  No chills, fever, night sweats or weight changes.  Cardiovascular:  No chest pain, positive for dyspnea on exertion (climbing stairs) edema, orthopnea, palpitations, paroxysmal nocturnal dyspnea. Dermatological: No rash, lesions/masses Respiratory: No cough, dyspnea Urologic: No hematuria, dysuria Abdominal:   No nausea, vomiting, diarrhea, bright red blood per rectum, melena, or hematemesis Neurologic:  No visual changes, wkns, changes in mental status. All other systems reviewed and are otherwise negative except as noted above.       Physical Exam    VS:  BP 130/72   Pulse 81   Ht 5\' 5"   (1.651 m)   Wt 172 lb 9.6 oz (78.3 kg)   SpO2 98%   BMI 28.72 kg/m  , BMI Body mass index is 28.72 kg/m.     GEN: Well nourished, well developed, in no acute distress. HEENT: normal. Neck: Supple, no JVD, carotid bruits, or masses. Cardiac: RRR, 2/6 systolic murmur, heard best at the right sternal border, no rubs, or gallops. No clubbing, cyanosis, mild nonpitting bilateral ankle edema.  Wearing support hose.  Radials/DP/PT 2+ and equal bilaterally.  Respiratory:  Respirations regular and unlabored, clear to auscultation bilaterally. GI: Soft, nontender, nondistended, BS + x 4. MS: no deformity or atrophy. Skin: warm and dry, no rash. Neuro:  Strength and sensation are intact. Psych: Normal affect.  Lab Results  Component Value Date   WBC 10.3 08/03/2022   HGB 13.5 08/03/2022   HCT 39.3 08/03/2022   MCV 82.9 08/03/2022   PLT 302 08/03/2022   Lab Results  Component Value Date   CREATININE 1.05 (H) 11/10/2022   BUN 22 11/10/2022   NA 139 11/10/2022   K 4.7 11/10/2022   CL 99 11/10/2022   CO2 24 11/10/2022   Lab Results  Component Value Date   ALT 14 02/23/2022   AST 15 02/23/2022   ALKPHOS 26 (L) 02/23/2022   BILITOT 1.4 (H) 02/23/2022   Lab Results  Component Value Date   CHOL 156 02/23/2022   HDL 84 02/23/2022   LDLCALC 59 02/23/2022   TRIG 67 02/23/2022   CHOLHDL 1.9 02/23/2022    Lab Results  Component Value Date   HGBA1C 7.0 (H) 02/22/2022     Review of Prior Studies   Echocardiogram 06/03/2023 1. Left ventricular ejection fraction, by estimation, is 60 to 65%. The  left ventricle has normal function. The left ventricle has no regional  wall motion abnormalities. Left ventricular diastolic function could not  be evaluated.   2. Right ventricular systolic function is normal. The right ventricular  size is normal. There is mildly elevated pulmonary artery systolic  pressure.   3. Left atrial size was moderately dilated.   4. The mitral valve is  abnormal. Mild to moderate mitral valve  regurgitation. Mild mitral stenosis. The mean mitral valve gradient is 5.5  mmHg. Severe mitral annular calcification.   5. The aortic valve is grossly normal. There is mild calcification of the  aortic valve. There is mild thickening of the aortic valve. Aortic valve  regurgitation is mild. Aortic valve sclerosis is present, with no evidence  of aortic valve stenosis.   6. The inferior vena cava is normal in size with greater than 50%  respiratory variability, suggesting right atrial pressure of 3 mmHg.   7. Cannot exclude a small PFO.    Assessment & Plan   1.  Chronic diastolic CHF: Weight has been maintained at home on her scale.  She may gain a pound or two 1 day and it is back to baseline the next.  She continues to have some chronic lower extremity edema for which she wears the support hose.  She takes Lasix as directed and is still getting good response from the 40 mg dose daily.  She denies any cramping or leg pain.  She is avoiding salt.  She is continue to walk at least 6000 steps daily, and would like to begin some upper arm strengthening.  I am not making any changes on her medication regimen.  Her labs have recently been completed by her primary care provider.  She reports that she was told her labs were within normal limits with no issues with kidney function.  2.  Permanent pacemaker in situ: Medtronic Azure DR.  Remote checks as scheduled per protocol.    3.  Hypertension: Excellent control of blood pressure today.  Continue amlodipine 10 mg daily,  4.  Type 2 diabetes: She requests information on whether or not she needs to add any new cardiac medications in the setting of diabetes.  For Dr. Erin Hearing note on last office visit he did recommend Jard[place] or Farxiga for renal protection.  I have written these names down for her to discuss with her primary care provider on follow-up.  5.  Hypercholesterolemia: Remains on simvastatin 20 mg  daily.  Labs are ordered by PCP.  Goal of LDL less than 100.         Signed, Bettey Mare. Liborio Nixon, ANP, AACC   07/13/2023 12:00 PM      Office [phone] Fax [phone]  Notice: This dictation was prepared with Dragon dictation along with smaller phrase technology. Any transcriptional errors that result from this process are unintentional and may not be corrected upon review.

## 2023-07-13 ENCOUNTER — Encounter: Payer: Self-pay | Admitting: Adult Health

## 2023-07-13 ENCOUNTER — Ambulatory Visit: Payer: Medicare HMO | Attending: Adult Health | Admitting: Adult Health

## 2023-07-13 VITALS — BP 130/72 | HR 81 | Ht 65.0 in | Wt 172.6 lb

## 2023-07-13 DIAGNOSIS — I10 Essential (primary) hypertension: Secondary | ICD-10-CM | POA: Diagnosis not present

## 2023-07-13 DIAGNOSIS — I480 Paroxysmal atrial fibrillation: Secondary | ICD-10-CM | POA: Diagnosis not present

## 2023-07-13 DIAGNOSIS — E119 Type 2 diabetes mellitus without complications: Secondary | ICD-10-CM

## 2023-07-13 DIAGNOSIS — E7800 Pure hypercholesterolemia, unspecified: Secondary | ICD-10-CM | POA: Diagnosis not present

## 2023-07-13 DIAGNOSIS — Z950 Presence of cardiac pacemaker: Secondary | ICD-10-CM

## 2023-07-13 MED ORDER — APIXABAN 5 MG PO TABS
5.0000 mg | ORAL_TABLET | Freq: Two times a day (BID) | ORAL | 3 refills | Status: DC
Start: 2023-07-13 — End: 2024-08-30

## 2023-07-13 NOTE — Patient Instructions (Signed)
Medication Instructions:  No Changes *If you need a refill on your cardiac medications before your next appointment, please call your pharmacy*   Lab Work: No Labs If you have labs (blood work) drawn today and your tests are completely normal, you will receive your results only by: MyChart Message (if you have MyChart) OR A paper copy in the mail If you have any lab test that is abnormal or we need to change your treatment, we will call you to review the results.   Testing/Procedures: No Testing   Follow-Up: At [facility], you and your health needs are our priority.  As part of our continuing mission to provide you with exceptional heart care, we have created designated Provider Care Teams.  These Care Teams include your primary Cardiologist (physician) and Advanced Practice Providers (APPs -  Physician Assistants and Nurse Practitioners) who all work together to provide you with the care you need, when you need it.  We recommend signing up for the patient portal called "MyChart".  Sign up information is provided on this After Visit Summary.  MyChart is used to connect with patients for Virtual Visits (Telemedicine).  Patients are able to view lab/test results, encounter notes, upcoming appointments, etc.  Non-urgent messages can be sent to your provider as well.   To learn more about what you can do with MyChart, go to [email or web address].    Your next appointment:   Keep Scheduled Appointment  Provider:   Thurmon Fair, MD

## 2023-07-20 DIAGNOSIS — H353132 Nonexudative age-related macular degeneration, bilateral, intermediate dry stage: Secondary | ICD-10-CM | POA: Diagnosis not present

## 2023-07-20 DIAGNOSIS — H35722 Serous detachment of retinal pigment epithelium, left eye: Secondary | ICD-10-CM | POA: Diagnosis not present

## 2023-07-20 DIAGNOSIS — H35373 Puckering of macula, bilateral: Secondary | ICD-10-CM | POA: Diagnosis not present

## 2023-08-03 DIAGNOSIS — Z1211 Encounter for screening for malignant neoplasm of colon: Secondary | ICD-10-CM | POA: Diagnosis not present

## 2023-08-03 DIAGNOSIS — R195 Other fecal abnormalities: Secondary | ICD-10-CM | POA: Diagnosis not present

## 2023-08-14 DIAGNOSIS — Z1211 Encounter for screening for malignant neoplasm of colon: Secondary | ICD-10-CM | POA: Diagnosis not present

## 2023-08-19 LAB — COLOGUARD: COLOGUARD: POSITIVE — AB

## 2023-08-31 ENCOUNTER — Telehealth: Payer: Self-pay | Admitting: *Deleted

## 2023-08-31 NOTE — Telephone Encounter (Signed)
Patient with diagnosis of afib on Eliquis for anticoagulation.    Procedure: colonoscopy Date of procedure: 09/15/23   CHA2DS2-VASc Score = 6   This indicates a 9.7% annual risk of stroke. The patient's score is based upon: CHF History: 1 HTN History: 1 Diabetes History: 1 Stroke History: 0 Vascular Disease History: 0 Age Score: 2 Gender Score: 1      CrCl 42 ml/min  Per office protocol, patient can hold Eliquis for 2 days prior to procedure.    **This guidance is not considered finalized until pre-operative APP has relayed final recommendations.**

## 2023-08-31 NOTE — Telephone Encounter (Signed)
Will route to pharm for Eliquis Doing well at last OV 07/2023, also has seen EP in the last 12 months

## 2023-08-31 NOTE — Telephone Encounter (Signed)
Pre-operative Risk Assessment    Patient Name: Sheri Moreno  DOB: 1941/06/04 MRN: [id number]      Request for Surgical Clearance    Procedure:   COLONSCOPY  Date of Surgery:  Clearance 09/15/23                                 Surgeon:  DR. Levora Angel Surgeon's Group or Practice Name:  [facility] Phone number:  [phone] Fax number:  [phone]   Type of Clearance Requested:   - Pharmacy:  Hold Apixaban (Eliquis) NOT INDICATED HOW LONG   Type of Anesthesia:   PROPOFOL   Additional requests/questions:    Wilhemina Cash   08/31/2023, 2:27 PM

## 2023-08-31 NOTE — Telephone Encounter (Signed)
Patient Name: Rekeisha Dockett  DOB: 10-28-1941 MRN: [id number]  Primary Cardiologist: Thurmon Fair, MD  Chart reviewed as part of pre-operative protocol coverage. Given past medical history and time since last visit, based on ACC/AHA guidelines, Kahdijah Warstler is at acceptable risk for the planned procedure. Per pharmD, per office protocol, patient can hold Eliquis for 2 days prior to procedure.    I will route this recommendation to the requesting party via Epic fax function and remove from pre-op pool.  Please call with questions.  Laurann Montana, PA-C 08/31/2023, 4:11 PM

## 2023-09-03 ENCOUNTER — Ambulatory Visit (INDEPENDENT_AMBULATORY_CARE_PROVIDER_SITE_OTHER): Payer: Medicare HMO

## 2023-09-03 DIAGNOSIS — I442 Atrioventricular block, complete: Secondary | ICD-10-CM | POA: Diagnosis not present

## 2023-09-03 LAB — CUP PACEART REMOTE DEVICE CHECK
Battery Remaining Longevity: 158 mo
Battery Voltage: 3.05 V
Brady Statistic AP VP Percent: 4.36 %
Brady Statistic AP VS Percent: 0.67 %
Brady Statistic AS VP Percent: 54.72 %
Brady Statistic AS VS Percent: 40.26 %
Brady Statistic RA Percent Paced: 5.05 %
Brady Statistic RV Percent Paced: 59.08 %
Date Time Interrogation Session: 20240925233012
Implantable Lead Connection Status: 753985
Implantable Lead Connection Status: 753985
Implantable Lead Implant Date: 20230320
Implantable Lead Implant Date: 20230320
Implantable Lead Location: 753859
Implantable Lead Location: 753860
Implantable Lead Model: 3830
Implantable Lead Model: 5076
Implantable Pulse Generator Implant Date: 20230320
Lead Channel Impedance Value: 323 Ohm
Lead Channel Impedance Value: 437 Ohm
Lead Channel Impedance Value: 475 Ohm
Lead Channel Impedance Value: 589 Ohm
Lead Channel Pacing Threshold Amplitude: 0.5 V
Lead Channel Pacing Threshold Amplitude: 1.125 V
Lead Channel Pacing Threshold Pulse Width: 0.4 ms
Lead Channel Pacing Threshold Pulse Width: 0.4 ms
Lead Channel Sensing Intrinsic Amplitude: 16.25 mV
Lead Channel Sensing Intrinsic Amplitude: 16.25 mV
Lead Channel Sensing Intrinsic Amplitude: 4.75 mV
Lead Channel Sensing Intrinsic Amplitude: 4.75 mV
Lead Channel Setting Pacing Amplitude: 1.5 V
Lead Channel Setting Pacing Amplitude: 2.5 V
Lead Channel Setting Pacing Pulse Width: 0.4 ms
Lead Channel Setting Sensing Sensitivity: 0.9 mV
Zone Setting Status: 755011
Zone Setting Status: 755011

## 2023-09-11 NOTE — Progress Notes (Signed)
Remote pacemaker transmission.   

## 2023-09-15 DIAGNOSIS — K635 Polyp of colon: Secondary | ICD-10-CM | POA: Diagnosis not present

## 2023-09-15 DIAGNOSIS — R195 Other fecal abnormalities: Secondary | ICD-10-CM | POA: Diagnosis not present

## 2023-09-15 DIAGNOSIS — D120 Benign neoplasm of cecum: Secondary | ICD-10-CM | POA: Diagnosis not present

## 2023-09-15 DIAGNOSIS — K5730 Diverticulosis of large intestine without perforation or abscess without bleeding: Secondary | ICD-10-CM | POA: Diagnosis not present

## 2023-09-15 DIAGNOSIS — D122 Benign neoplasm of ascending colon: Secondary | ICD-10-CM | POA: Diagnosis not present

## 2023-09-17 DIAGNOSIS — L820 Inflamed seborrheic keratosis: Secondary | ICD-10-CM | POA: Diagnosis not present

## 2023-09-17 DIAGNOSIS — K635 Polyp of colon: Secondary | ICD-10-CM | POA: Diagnosis not present

## 2023-09-17 DIAGNOSIS — L814 Other melanin hyperpigmentation: Secondary | ICD-10-CM | POA: Diagnosis not present

## 2023-09-17 DIAGNOSIS — Z789 Other specified health status: Secondary | ICD-10-CM | POA: Diagnosis not present

## 2023-09-17 DIAGNOSIS — D122 Benign neoplasm of ascending colon: Secondary | ICD-10-CM | POA: Diagnosis not present

## 2023-09-17 DIAGNOSIS — Z85828 Personal history of other malignant neoplasm of skin: Secondary | ICD-10-CM | POA: Diagnosis not present

## 2023-09-17 DIAGNOSIS — D120 Benign neoplasm of cecum: Secondary | ICD-10-CM | POA: Diagnosis not present

## 2023-09-17 DIAGNOSIS — L918 Other hypertrophic disorders of the skin: Secondary | ICD-10-CM | POA: Diagnosis not present

## 2023-09-17 DIAGNOSIS — L2989 Other pruritus: Secondary | ICD-10-CM | POA: Diagnosis not present

## 2023-09-17 DIAGNOSIS — D225 Melanocytic nevi of trunk: Secondary | ICD-10-CM | POA: Diagnosis not present

## 2023-09-17 DIAGNOSIS — L821 Other seborrheic keratosis: Secondary | ICD-10-CM | POA: Diagnosis not present

## 2023-09-17 DIAGNOSIS — Z08 Encounter for follow-up examination after completed treatment for malignant neoplasm: Secondary | ICD-10-CM | POA: Diagnosis not present

## 2023-09-21 ENCOUNTER — Other Ambulatory Visit: Payer: Self-pay | Admitting: Cardiovascular Disease

## 2023-11-10 DIAGNOSIS — I480 Paroxysmal atrial fibrillation: Secondary | ICD-10-CM | POA: Diagnosis not present

## 2023-11-10 DIAGNOSIS — E782 Mixed hyperlipidemia: Secondary | ICD-10-CM | POA: Diagnosis not present

## 2023-11-10 DIAGNOSIS — E1169 Type 2 diabetes mellitus with other specified complication: Secondary | ICD-10-CM | POA: Diagnosis not present

## 2023-11-10 DIAGNOSIS — E1121 Type 2 diabetes mellitus with diabetic nephropathy: Secondary | ICD-10-CM | POA: Diagnosis not present

## 2023-11-10 DIAGNOSIS — D6869 Other thrombophilia: Secondary | ICD-10-CM | POA: Diagnosis not present

## 2023-11-10 DIAGNOSIS — I10 Essential (primary) hypertension: Secondary | ICD-10-CM | POA: Diagnosis not present

## 2023-11-10 DIAGNOSIS — I110 Hypertensive heart disease with heart failure: Secondary | ICD-10-CM | POA: Diagnosis not present

## 2023-11-10 DIAGNOSIS — I442 Atrioventricular block, complete: Secondary | ICD-10-CM | POA: Diagnosis not present

## 2023-11-10 DIAGNOSIS — Z0000 Encounter for general adult medical examination without abnormal findings: Secondary | ICD-10-CM | POA: Diagnosis not present

## 2023-11-10 DIAGNOSIS — I5032 Chronic diastolic (congestive) heart failure: Secondary | ICD-10-CM | POA: Diagnosis not present

## 2023-11-11 DIAGNOSIS — I480 Paroxysmal atrial fibrillation: Secondary | ICD-10-CM | POA: Diagnosis not present

## 2023-11-11 DIAGNOSIS — I442 Atrioventricular block, complete: Secondary | ICD-10-CM | POA: Diagnosis not present

## 2023-11-11 DIAGNOSIS — Z950 Presence of cardiac pacemaker: Secondary | ICD-10-CM | POA: Diagnosis not present

## 2023-11-11 DIAGNOSIS — G4733 Obstructive sleep apnea (adult) (pediatric): Secondary | ICD-10-CM | POA: Diagnosis not present

## 2023-11-11 DIAGNOSIS — I5032 Chronic diastolic (congestive) heart failure: Secondary | ICD-10-CM | POA: Diagnosis not present

## 2023-11-25 ENCOUNTER — Ambulatory Visit: Payer: Medicare HMO | Admitting: Cardiovascular Disease

## 2023-12-01 ENCOUNTER — Ambulatory Visit: Payer: Medicare HMO | Attending: Cardiovascular Disease | Admitting: Cardiovascular Disease

## 2023-12-01 ENCOUNTER — Encounter: Payer: Self-pay | Admitting: Cardiovascular Disease

## 2023-12-01 VITALS — BP 122/58 | HR 71 | Ht 65.0 in | Wt 178.6 lb

## 2023-12-01 DIAGNOSIS — I442 Atrioventricular block, complete: Secondary | ICD-10-CM | POA: Diagnosis not present

## 2023-12-01 DIAGNOSIS — I10 Essential (primary) hypertension: Secondary | ICD-10-CM

## 2023-12-01 DIAGNOSIS — I358 Other nonrheumatic aortic valve disorders: Secondary | ICD-10-CM | POA: Diagnosis not present

## 2023-12-01 DIAGNOSIS — E7800 Pure hypercholesterolemia, unspecified: Secondary | ICD-10-CM

## 2023-12-01 DIAGNOSIS — Z950 Presence of cardiac pacemaker: Secondary | ICD-10-CM | POA: Diagnosis not present

## 2023-12-01 DIAGNOSIS — D6869 Other thrombophilia: Secondary | ICD-10-CM

## 2023-12-01 DIAGNOSIS — I5032 Chronic diastolic (congestive) heart failure: Secondary | ICD-10-CM

## 2023-12-01 DIAGNOSIS — E119 Type 2 diabetes mellitus without complications: Secondary | ICD-10-CM

## 2023-12-01 DIAGNOSIS — I342 Nonrheumatic mitral (valve) stenosis: Secondary | ICD-10-CM

## 2023-12-01 DIAGNOSIS — I480 Paroxysmal atrial fibrillation: Secondary | ICD-10-CM

## 2023-12-01 DIAGNOSIS — E038 Other specified hypothyroidism: Secondary | ICD-10-CM

## 2023-12-01 NOTE — Progress Notes (Signed)
Cardiology Office Note:    Date:  12/01/2023   ID:  Sheri Moreno January 14, 1941, MRN [id number]  PCP:  Joycelyn Rua, MD   [facility] Providers Cardiologist:  Thurmon Fair, MD     Referring MD: Deatra James, MD   Chief Complaint  Patient presents with   Atrial Fibrillation    History of Present Illness:    Sheri Moreno is a 82 y.o. female with a hx of hypertension, chronic diastolic heart failure, type 2 diabetes mellitus, OSA, borderline obesity who recently received a dual-chamber permanent pacemaker (Dr. 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  The pacemaker recorded a 13-hour episode of asymptomatic, rate-controlled paroxysmal atrial fibrillation that occurred on 07/01/2022.   Since her last appointment she has not had any major new health problems.  She remains unaware of any arrhythmia.  Her pacemaker did record an episode of atrial fibrillation lasting about 2 hours in July.  She has not had any new episodes of nonsustained VT but has had a couple of very brief episodes of atrial tachycardia with 1: 1 AV conduction, also most recently in July.  Device function is normal.  Estimated generator longevity is 12.6 years.  She has 7.7% atrial pacing and 57% ventricular pacing.  All lead parameters are normal.  Heart rate histogram distribution is appropriate.  She has mild symmetrical lower extremity edema, towards the end of the day, but this resolves after lying in bed overnight.  The patient specifically denies any chest pain at rest or with exertion, dyspnea at rest or with exertion, orthopnea, paroxysmal nocturnal dyspnea, syncope, palpitations, focal neurological deficits, intermittent claudication, unexplained weight gain, cough, hemoptysis or wheezing.  Has not had any falls or serious bleeding problems.  She had a repeat echocardiogram in June 2024: She  has normal left ventricular systolic function, probably mild-moderate mitral stenosis (mean gradient 6 mmHg at 74 bpm) and some signs of elevated filling pressures.  After that echo encouraged her to start taking [place] or Jardiance rather than glipizide.  The transitional actually happened on January 1 of next year.  C-Myc control is good with a hemoglobin A1c of 7.1%.  She has excellent lipid profile parameters.  She has normal renal function with a creatinine of 1.05 but does have known proteinuria.  Sheri Moreno's grandson is Barbette Merino.   Past Medical History:  Diagnosis Date   Cataract    Diabetes mellitus without complication (HCC)    Hypertension    Obesity     Past Surgical History:  Procedure Laterality Date   CATARACT EXTRACTION     PACEMAKER IMPLANT N/A 02/24/2022   Procedure: PACEMAKER IMPLANT;  Surgeon: Marinus Maw, MD;  Location: [facility];  Service: Cardiovascular;  Laterality: N/A;   TONSILLECTOMY      Current Medications: Current Meds  Medication Sig   amLODipine (NORVASC) 10 MG tablet Take 10 mg by mouth daily.   apixaban (ELIQUIS) 5 MG TABS tablet Take 1 tablet (5 mg total) by mouth 2 (two) times daily.   Calcium Carb-Cholecalciferol 600-10 MG-MCG TABS Take 1 tablet by mouth daily.   furosemide (LASIX) 40 MG tablet TAKE 1 TABLET (40 MG TOTAL) BY MOUTH DAILY. TAKE 80 MG ONCE DAILY FOR A WEIGHT OVER 177 LBS   glipiZIDE (GLUCOTROL) 10 MG tablet Take 10 mg by mouth daily before breakfast.   metFORMIN (GLUCOPHAGE-XR) 500 MG 24 hr tablet Take 1,000 mg  by mouth 2 (two) times daily with a meal.   metoprolol succinate (TOPROL-XL) 25 MG 24 hr tablet TAKE 1 TABLET (25 MG TOTAL) BY MOUTH DAILY.   Misc Natural Products (OSTEO BI-FLEX ADV JOINT SHIELD PO) Take 1 tablet by mouth daily.     potassium chloride SA (KLOR-CON M20) 20 MEQ tablet Take 2 tablets (40 mEq total) by mouth daily.   simvastatin (ZOCOR) 20 MG tablet Take 20 mg by mouth daily.      Allergies:   Ace inhibitors, Clindamycin hcl, Ethyl chloride, and Penicillins   Social History   Socioeconomic History   Marital status: Single    Spouse name: Not on file   Number of children: Not on file   Years of education: Not on file   Highest education level: Not on file  Occupational History   Occupation: Radio producer  Tobacco Use   Smoking status: Never   Smokeless tobacco: Never  Substance and Sexual Activity   Alcohol use: Not Currently    Comment: rarely   Drug use: Not on file   Sexual activity: Not on file  Other Topics Concern   Not on file  Social History Narrative   ** Merged History Encounter **       Social Drivers of Health   Financial Resource Strain: Not on file  Food Insecurity: Not on file  Transportation Needs: Not on file  Physical Activity: Not on file  Stress: Not on file  Social Connections: Not on file     Family History: The patient's family history includes Hypertension in an other family member; Lung cancer in her maternal aunt; Pancreatic cancer in her sister; Stomach cancer in her father.  ROS:   Please see the history of present illness.     All other systems reviewed and are negative.  EKGs/Labs/Other Studies Reviewed:    The following studies were reviewed today: Comprehensive pacemaker check performed by me.  Echocardiogram 06/03/2023   1. Left ventricular ejection fraction, by estimation, is 60 to 65%. The  left ventricle has normal function. The left ventricle has no regional  wall motion abnormalities. Left ventricular diastolic function could not  be evaluated.   2. Right ventricular systolic function is normal. The right ventricular  size is normal. There is mildly elevated pulmonary artery systolic  pressure.   3. Left atrial size was moderately dilated.   4. The mitral valve is abnormal. Mild to moderate mitral valve  regurgitation. Mild mitral stenosis. The mean mitral valve gradient is 5.5  mmHg.  Severe mitral annular calcification.   5. The aortic valve is grossly normal. There is mild calcification of the  aortic valve. There is mild thickening of the aortic valve. Aortic valve  regurgitation is mild. Aortic valve sclerosis is present, with no evidence  of aortic valve stenosis.   6. The inferior vena cava is normal in size with greater than 50%  respiratory variability, suggesting right atrial pressure of 3 mmHg.   7. Cannot exclude a small PFO.   Comparison(s): No significant change from prior study.    EKG:    EKG Interpretation Date/Time:  Tuesday December 01 2023 09:56:30 EST Ventricular Rate:  71 PR Interval:  236 QRS Duration:  92 QT Interval:  426 QTC Calculation: 462 R Axis:   1  Text Interpretation: Atrial-sensed ventricular-paced rhythm with prolonged AV conduction When compared with ECG of 25-Jun-2023 10:03, Electronic ventricular pacemaker has replaced Sinus rhythm Confirmed by Shanaya Schneck [phone]) on 12/01/2023 9:57:59  AM         Recent Labs: No results found for requested labs within last 365 days.  Recent Lipid Panel    Component Value Date/Time   CHOL 156 02/23/2022 0551   TRIG 67 02/23/2022 0551   HDL 84 02/23/2022 0551   CHOLHDL 1.9 02/23/2022 0551   VLDL 13 02/23/2022 0551   LDLCALC 59 02/23/2022 0551     Risk Assessment/Calculations:    CHA2DS2-VASc Score = 6   This indicates a 9.7% annual risk of stroke. The patient's score is based upon: CHF History: 1 HTN History: 1 Diabetes History: 1 Stroke History: 0 Vascular Disease History: 0 Age Score: 2 Gender Score: 1          Physical Exam:    VS:  BP (!) 122/58 (BP Location: Left Arm, Patient Position: Sitting)   Pulse 71   Ht 5\' 5"  (1.651 m)   Wt 178 lb 9.6 oz (81 kg)   SpO2 95%   BMI 29.72 kg/m     Wt Readings from Last 3 Encounters:  12/01/23 178 lb 9.6 oz (81 kg)  07/13/23 172 lb 9.6 oz (78.3 kg)  06/25/23 176 lb (79.8 kg)     General: Alert, oriented x3, no  distress, well-healed subclavian pacemaker site Head: no evidence of trauma, PERRL, EOMI, no exophtalmos or lid lag, no myxedema, no xanthelasma; normal ears, nose and oropharynx Neck: normal jugular venous pulsations and no hepatojugular reflux; brisk carotid pulses without delay and no carotid bruits Chest: clear to auscultation, no signs of consolidation by percussion or palpation, normal fremitus, symmetrical and full respiratory excursions Cardiovascular: normal position and quality of the apical impulse, regular rhythm, normal first and second heart sounds, 2/6 mid peaking aortic ejection murmur, no diastolic murmurs, rubs or gallops Abdomen: no tenderness or distention, no masses by palpation, no abnormal pulsatility or arterial bruits, normal bowel sounds, no hepatosplenomegaly Extremities: no clubbing, cyanosis or edema; 2+ radial, ulnar and brachial pulses bilaterally; 2+ right femoral, posterior tibial and dorsalis pedis pulses; 2+ left femoral, posterior tibial and dorsalis pedis pulses; no subclavian or femoral bruits Neurological: grossly nonfocal Psych: Normal mood and affect   ASSESSMENT:    1. Paroxysmal atrial fibrillation (HCC)   2. Acquired thrombophilia (HCC)   3. CHB (complete heart block) (HCC)   4. Essential hypertension   5. Pacemaker   6. Chronic diastolic heart failure (HCC)   7. Nonrheumatic mitral valve stenosis   8. Aortic valve sclerosis   9. Type 2 diabetes mellitus without complication, without long-term current use of insulin (HCC)   10. Hypercholesterolemia   11. Subclinical hypothyroidism     PLAN:    In order of problems listed above:  Paroxysmal atrial fibrillation: Asymptomatic, infrequent, well rate controlled.  On anticoagulants Anticoagulation: No bleeding problems, no falls CHB: There has been an increase in the need for ventricular pacing now up to roughly 50%. PPM: Normal device function.  Followed by Dr. Ladona Ridgel. HTN: Well-controlled on  amlodipine, which is probably responsible for her mild ankle edema at the end of the day.  No reason to adjust medications at this point. CHF: NYHA functional class I-II and euvolemic without the need for diuretic dose adjustment.  Weight is virtually identical with last year's weight.  Suspect symptoms will improve further when she starts the [place].  Advised her to make sure that she drinks plenty of fluids.  Warned her about the potential side effects including urinary tract infections, groin yeast infections, rare  risk of Fournier's gangrene. MS: Follow-up echo confirms that she has only mild-moderate mitral stenosis. AS: She has some aortic valve sclerosis, but does not really have aortic stenosis.  On her initial echocardiogram, performed during idioventricular rhythm, she had very high stroke-volume and exaggerated gradients.  On the follow-up echo the aortic valve gradients remained in normal range. DM: Good control with A1c 7.1 % HLP: All lipid parameters in desirable range including excellent HDL cholesterol.  The potential interaction between amlodipine and simvastatin is acknowledged, but has been well-tolerated and the dose of simvastatin is low.  Continue same medications. Subclinical hypothyroidism: Most recent TSH is once again elevated at 8.52.  Clinically she is euthyroid.            Medication Adjustments/Labs and Tests Ordered: Current medicines are reviewed at length with the patient today.  Concerns regarding medicines are outlined above.  Orders Placed This Encounter  Procedures   EKG 12-Lead   No orders of the defined types were placed in this encounter.   Patient Instructions  Medication Instructions:  No changes *If you need a refill on your cardiac medications before your next appointment, please call your pharmacy*  Follow-Up: At [facility], you and your health needs are our priority.  As part of our continuing mission to provide you with  exceptional heart care, we have created designated Provider Care Teams.  These Care Teams include your primary Cardiologist (physician) and Advanced Practice Providers (APPs -  Physician Assistants and Nurse Practitioners) who all work together to provide you with the care you need, when you need it.  We recommend signing up for the patient portal called "MyChart".  Sign up information is provided on this After Visit Summary.  MyChart is used to connect with patients for Virtual Visits (Telemedicine).  Patients are able to view lab/test results, encounter notes, upcoming appointments, etc.  Non-urgent messages can be sent to your provider as well.   To learn more about what you can do with MyChart, go to [email or web address].    Your next appointment:   1 year(s)  Provider:   Thurmon Fair, MD            Signed, Thurmon Fair, MD  12/01/2023 6:18 PM    [facility] does not have a paper

## 2023-12-01 NOTE — Patient Instructions (Signed)

## 2023-12-03 ENCOUNTER — Ambulatory Visit (INDEPENDENT_AMBULATORY_CARE_PROVIDER_SITE_OTHER): Payer: Medicare HMO

## 2023-12-03 DIAGNOSIS — I442 Atrioventricular block, complete: Secondary | ICD-10-CM | POA: Diagnosis not present

## 2023-12-03 LAB — CUP PACEART REMOTE DEVICE CHECK
Battery Remaining Longevity: 151 mo
Battery Voltage: 3.02 V
Brady Statistic AP VP Percent: 15.83 %
Brady Statistic AP VS Percent: 0 %
Brady Statistic AS VP Percent: 83.9 %
Brady Statistic AS VS Percent: 0.27 %
Brady Statistic RA Percent Paced: 16.05 %
Brady Statistic RV Percent Paced: 99.73 %
Date Time Interrogation Session: 20241225230039
Implantable Lead Connection Status: 753985
Implantable Lead Connection Status: 753985
Implantable Lead Implant Date: 20230320
Implantable Lead Implant Date: 20230320
Implantable Lead Location: 753859
Implantable Lead Location: 753860
Implantable Lead Model: 3830
Implantable Lead Model: 5076
Implantable Pulse Generator Implant Date: 20230320
Lead Channel Impedance Value: 304 Ohm
Lead Channel Impedance Value: 437 Ohm
Lead Channel Impedance Value: 456 Ohm
Lead Channel Impedance Value: 532 Ohm
Lead Channel Pacing Threshold Amplitude: 0.375 V
Lead Channel Pacing Threshold Amplitude: 1.125 V
Lead Channel Pacing Threshold Pulse Width: 0.4 ms
Lead Channel Pacing Threshold Pulse Width: 0.4 ms
Lead Channel Sensing Intrinsic Amplitude: 14.875 mV
Lead Channel Sensing Intrinsic Amplitude: 14.875 mV
Lead Channel Sensing Intrinsic Amplitude: 4.375 mV
Lead Channel Sensing Intrinsic Amplitude: 4.375 mV
Lead Channel Setting Pacing Amplitude: 1.5 V
Lead Channel Setting Pacing Amplitude: 2.5 V
Lead Channel Setting Pacing Pulse Width: 0.4 ms
Lead Channel Setting Sensing Sensitivity: 0.9 mV
Zone Setting Status: 755011
Zone Setting Status: 755011

## 2024-01-03 ENCOUNTER — Other Ambulatory Visit: Payer: Self-pay | Admitting: Cardiovascular Disease

## 2024-01-11 ENCOUNTER — Other Ambulatory Visit: Payer: Self-pay | Admitting: Gastroenterology

## 2024-01-11 DIAGNOSIS — Z8679 Personal history of other diseases of the circulatory system: Secondary | ICD-10-CM | POA: Diagnosis not present

## 2024-01-11 DIAGNOSIS — Z09 Encounter for follow-up examination after completed treatment for conditions other than malignant neoplasm: Secondary | ICD-10-CM | POA: Diagnosis not present

## 2024-01-11 DIAGNOSIS — Z7901 Long term (current) use of anticoagulants: Secondary | ICD-10-CM | POA: Diagnosis not present

## 2024-01-11 DIAGNOSIS — D122 Benign neoplasm of ascending colon: Secondary | ICD-10-CM | POA: Diagnosis not present

## 2024-01-11 DIAGNOSIS — Z860101 Personal history of adenomatous and serrated colon polyps: Secondary | ICD-10-CM | POA: Diagnosis not present

## 2024-01-12 NOTE — Progress Notes (Signed)
 Remote pacemaker transmission.

## 2024-01-18 DIAGNOSIS — H35373 Puckering of macula, bilateral: Secondary | ICD-10-CM | POA: Diagnosis not present

## 2024-01-18 DIAGNOSIS — H35722 Serous detachment of retinal pigment epithelium, left eye: Secondary | ICD-10-CM | POA: Diagnosis not present

## 2024-01-18 DIAGNOSIS — H353132 Nonexudative age-related macular degeneration, bilateral, intermediate dry stage: Secondary | ICD-10-CM | POA: Diagnosis not present

## 2024-02-12 DIAGNOSIS — E119 Type 2 diabetes mellitus without complications: Secondary | ICD-10-CM | POA: Diagnosis not present

## 2024-02-12 DIAGNOSIS — H524 Presbyopia: Secondary | ICD-10-CM | POA: Diagnosis not present

## 2024-02-12 DIAGNOSIS — H40053 Ocular hypertension, bilateral: Secondary | ICD-10-CM | POA: Diagnosis not present

## 2024-02-12 DIAGNOSIS — Z0100 Encounter for examination of eyes and vision without abnormal findings: Secondary | ICD-10-CM | POA: Diagnosis not present

## 2024-02-12 DIAGNOSIS — H353132 Nonexudative age-related macular degeneration, bilateral, intermediate dry stage: Secondary | ICD-10-CM | POA: Diagnosis not present

## 2024-02-12 DIAGNOSIS — Z961 Presence of intraocular lens: Secondary | ICD-10-CM | POA: Diagnosis not present

## 2024-02-12 DIAGNOSIS — H52223 Regular astigmatism, bilateral: Secondary | ICD-10-CM | POA: Diagnosis not present

## 2024-02-12 DIAGNOSIS — H5201 Hypermetropia, right eye: Secondary | ICD-10-CM | POA: Diagnosis not present

## 2024-03-03 ENCOUNTER — Ambulatory Visit (INDEPENDENT_AMBULATORY_CARE_PROVIDER_SITE_OTHER): Payer: Medicare HMO

## 2024-03-03 ENCOUNTER — Encounter: Payer: Self-pay | Admitting: Internal Medicine

## 2024-03-03 DIAGNOSIS — I442 Atrioventricular block, complete: Secondary | ICD-10-CM

## 2024-03-03 LAB — CUP PACEART REMOTE DEVICE CHECK
Battery Remaining Longevity: 149 mo
Battery Voltage: 3.03 V
Brady Statistic AP VP Percent: 13.46 %
Brady Statistic AP VS Percent: 0 %
Brady Statistic AS VP Percent: 86.38 %
Brady Statistic AS VS Percent: 0.16 %
Brady Statistic RA Percent Paced: 13.57 %
Brady Statistic RV Percent Paced: 99.84 %
Date Time Interrogation Session: 20250326232134
Implantable Lead Connection Status: 753985
Implantable Lead Connection Status: 753985
Implantable Lead Implant Date: 20230320
Implantable Lead Implant Date: 20230320
Implantable Lead Location: 753859
Implantable Lead Location: 753860
Implantable Lead Model: 3830
Implantable Lead Model: 5076
Implantable Pulse Generator Implant Date: 20230320
Lead Channel Impedance Value: 323 Ohm
Lead Channel Impedance Value: 437 Ohm
Lead Channel Impedance Value: 513 Ohm
Lead Channel Impedance Value: 589 Ohm
Lead Channel Pacing Threshold Amplitude: 0.375 V
Lead Channel Pacing Threshold Amplitude: 1 V
Lead Channel Pacing Threshold Pulse Width: 0.4 ms
Lead Channel Pacing Threshold Pulse Width: 0.4 ms
Lead Channel Sensing Intrinsic Amplitude: 22.625 mV
Lead Channel Sensing Intrinsic Amplitude: 22.625 mV
Lead Channel Sensing Intrinsic Amplitude: 6.125 mV
Lead Channel Sensing Intrinsic Amplitude: 6.125 mV
Lead Channel Setting Pacing Amplitude: 1.5 V
Lead Channel Setting Pacing Amplitude: 2 V
Lead Channel Setting Pacing Pulse Width: 0.4 ms
Lead Channel Setting Sensing Sensitivity: 0.9 mV
Zone Setting Status: 755011
Zone Setting Status: 755011

## 2024-03-11 ENCOUNTER — Ambulatory Visit
Admission: RE | Admit: 2024-03-11 | Discharge: 2024-03-11 | Disposition: A | Discharge status: Home / Self Care | Payer: Medicare HMO | Source: Ambulatory Visit | Attending: Gastroenterology | Admitting: Gastroenterology

## 2024-03-11 DIAGNOSIS — K5730 Diverticulosis of large intestine without perforation or abscess without bleeding: Secondary | ICD-10-CM | POA: Diagnosis not present

## 2024-03-11 DIAGNOSIS — K429 Umbilical hernia without obstruction or gangrene: Secondary | ICD-10-CM | POA: Diagnosis not present

## 2024-03-11 DIAGNOSIS — Z860101 Personal history of adenomatous and serrated colon polyps: Secondary | ICD-10-CM

## 2024-03-17 DIAGNOSIS — D225 Melanocytic nevi of trunk: Secondary | ICD-10-CM | POA: Diagnosis not present

## 2024-03-17 DIAGNOSIS — Z85828 Personal history of other malignant neoplasm of skin: Secondary | ICD-10-CM | POA: Diagnosis not present

## 2024-03-17 DIAGNOSIS — L814 Other melanin hyperpigmentation: Secondary | ICD-10-CM | POA: Diagnosis not present

## 2024-03-17 DIAGNOSIS — Z08 Encounter for follow-up examination after completed treatment for malignant neoplasm: Secondary | ICD-10-CM | POA: Diagnosis not present

## 2024-03-17 DIAGNOSIS — L821 Other seborrheic keratosis: Secondary | ICD-10-CM | POA: Diagnosis not present

## 2024-04-13 NOTE — Progress Notes (Signed)
 Remote pacemaker transmission.

## 2024-05-04 DIAGNOSIS — I5032 Chronic diastolic (congestive) heart failure: Secondary | ICD-10-CM | POA: Diagnosis not present

## 2024-05-04 DIAGNOSIS — I480 Paroxysmal atrial fibrillation: Secondary | ICD-10-CM | POA: Diagnosis not present

## 2024-05-04 DIAGNOSIS — E1169 Type 2 diabetes mellitus with other specified complication: Secondary | ICD-10-CM | POA: Diagnosis not present

## 2024-05-04 DIAGNOSIS — Z6828 Body mass index (BMI) 28.0-28.9, adult: Secondary | ICD-10-CM | POA: Diagnosis not present

## 2024-05-04 DIAGNOSIS — I10 Essential (primary) hypertension: Secondary | ICD-10-CM | POA: Diagnosis not present

## 2024-05-04 DIAGNOSIS — I442 Atrioventricular block, complete: Secondary | ICD-10-CM | POA: Diagnosis not present

## 2024-05-04 DIAGNOSIS — D6869 Other thrombophilia: Secondary | ICD-10-CM | POA: Diagnosis not present

## 2024-05-04 DIAGNOSIS — E782 Mixed hyperlipidemia: Secondary | ICD-10-CM | POA: Diagnosis not present

## 2024-05-16 DIAGNOSIS — E1169 Type 2 diabetes mellitus with other specified complication: Secondary | ICD-10-CM | POA: Diagnosis not present

## 2024-06-02 ENCOUNTER — Ambulatory Visit: Payer: Medicare HMO

## 2024-06-02 DIAGNOSIS — I442 Atrioventricular block, complete: Secondary | ICD-10-CM

## 2024-06-02 LAB — CUP PACEART REMOTE DEVICE CHECK
Battery Remaining Longevity: 143 mo
Battery Voltage: 3.02 V
Brady Statistic AP VP Percent: 19.49 %
Brady Statistic AP VS Percent: 0 %
Brady Statistic AS VP Percent: 79.9 %
Brady Statistic AS VS Percent: 0.61 %
Brady Statistic RA Percent Paced: 19.85 %
Brady Statistic RV Percent Paced: 99.39 %
Date Time Interrogation Session: 20250625194223
Implantable Lead Connection Status: 753985
Implantable Lead Connection Status: 753985
Implantable Lead Implant Date: 20230320
Implantable Lead Implant Date: 20230320
Implantable Lead Location: 753859
Implantable Lead Location: 753860
Implantable Lead Model: 3830
Implantable Lead Model: 5076
Implantable Pulse Generator Implant Date: 20230320
Lead Channel Impedance Value: 304 Ohm
Lead Channel Impedance Value: 437 Ohm
Lead Channel Impedance Value: 475 Ohm
Lead Channel Impedance Value: 570 Ohm
Lead Channel Pacing Threshold Amplitude: 0.375 V
Lead Channel Pacing Threshold Amplitude: 1 V
Lead Channel Pacing Threshold Pulse Width: 0.4 ms
Lead Channel Pacing Threshold Pulse Width: 0.4 ms
Lead Channel Sensing Intrinsic Amplitude: 23.625 mV
Lead Channel Sensing Intrinsic Amplitude: 23.625 mV
Lead Channel Sensing Intrinsic Amplitude: 4.125 mV
Lead Channel Sensing Intrinsic Amplitude: 4.125 mV
Lead Channel Setting Pacing Amplitude: 1.5 V
Lead Channel Setting Pacing Amplitude: 2 V
Lead Channel Setting Pacing Pulse Width: 0.4 ms
Lead Channel Setting Sensing Sensitivity: 0.9 mV
Zone Setting Status: 755011
Zone Setting Status: 755011

## 2024-06-05 ENCOUNTER — Ambulatory Visit: Payer: Self-pay | Admitting: Internal Medicine

## 2024-08-01 DIAGNOSIS — H35373 Puckering of macula, bilateral: Secondary | ICD-10-CM | POA: Diagnosis not present

## 2024-08-01 DIAGNOSIS — H43811 Vitreous degeneration, right eye: Secondary | ICD-10-CM | POA: Diagnosis not present

## 2024-08-01 DIAGNOSIS — H35722 Serous detachment of retinal pigment epithelium, left eye: Secondary | ICD-10-CM | POA: Diagnosis not present

## 2024-08-17 DIAGNOSIS — E1169 Type 2 diabetes mellitus with other specified complication: Secondary | ICD-10-CM | POA: Diagnosis not present

## 2024-08-30 ENCOUNTER — Other Ambulatory Visit: Payer: Self-pay | Admitting: Adult Health

## 2024-08-30 DIAGNOSIS — I480 Paroxysmal atrial fibrillation: Secondary | ICD-10-CM

## 2024-08-30 NOTE — Telephone Encounter (Signed)
 Prescription refill request for Eliquis  received. Indication:afib Last office visit:12/24 Drm:wzzid labs Age: 83 Weight:81  kg  Prescription refilled

## 2024-09-01 ENCOUNTER — Ambulatory Visit: Payer: Medicare HMO

## 2024-09-01 DIAGNOSIS — I480 Paroxysmal atrial fibrillation: Secondary | ICD-10-CM | POA: Diagnosis not present

## 2024-09-01 LAB — CUP PACEART REMOTE DEVICE CHECK
Battery Remaining Longevity: 139 mo
Battery Voltage: 3.02 V
Brady Statistic AP VP Percent: 19.41 %
Brady Statistic AP VS Percent: 0 %
Brady Statistic AS VP Percent: 79.25 %
Brady Statistic AS VS Percent: 1.34 %
Brady Statistic RA Percent Paced: 20.3 %
Brady Statistic RV Percent Paced: 98.66 %
Date Time Interrogation Session: 20250924193938
Implantable Lead Connection Status: 753985
Implantable Lead Connection Status: 753985
Implantable Lead Implant Date: 20230320
Implantable Lead Implant Date: 20230320
Implantable Lead Location: 753859
Implantable Lead Location: 753860
Implantable Lead Model: 3830
Implantable Lead Model: 5076
Implantable Pulse Generator Implant Date: 20230320
Lead Channel Impedance Value: 323 Ohm
Lead Channel Impedance Value: 456 Ohm
Lead Channel Impedance Value: 475 Ohm
Lead Channel Impedance Value: 608 Ohm
Lead Channel Pacing Threshold Amplitude: 0.5 V
Lead Channel Pacing Threshold Amplitude: 1 V
Lead Channel Pacing Threshold Pulse Width: 0.4 ms
Lead Channel Pacing Threshold Pulse Width: 0.4 ms
Lead Channel Sensing Intrinsic Amplitude: 17.5 mV
Lead Channel Sensing Intrinsic Amplitude: 17.5 mV
Lead Channel Sensing Intrinsic Amplitude: 4.75 mV
Lead Channel Sensing Intrinsic Amplitude: 4.75 mV
Lead Channel Setting Pacing Amplitude: 1.5 V
Lead Channel Setting Pacing Amplitude: 2 V
Lead Channel Setting Pacing Pulse Width: 0.4 ms
Lead Channel Setting Sensing Sensitivity: 0.9 mV
Zone Setting Status: 755011
Zone Setting Status: 755011

## 2024-09-02 NOTE — Progress Notes (Signed)
 Remote PPM Transmission

## 2024-09-04 ENCOUNTER — Ambulatory Visit: Payer: Self-pay | Admitting: Internal Medicine

## 2024-09-05 NOTE — Progress Notes (Signed)
 Remote PPM Transmission

## 2024-09-21 DIAGNOSIS — Z85828 Personal history of other malignant neoplasm of skin: Secondary | ICD-10-CM | POA: Diagnosis not present

## 2024-09-21 DIAGNOSIS — L853 Xerosis cutis: Secondary | ICD-10-CM | POA: Diagnosis not present

## 2024-09-21 DIAGNOSIS — L821 Other seborrheic keratosis: Secondary | ICD-10-CM | POA: Diagnosis not present

## 2024-09-21 DIAGNOSIS — L570 Actinic keratosis: Secondary | ICD-10-CM | POA: Diagnosis not present

## 2024-09-21 DIAGNOSIS — Z08 Encounter for follow-up examination after completed treatment for malignant neoplasm: Secondary | ICD-10-CM | POA: Diagnosis not present

## 2024-09-21 DIAGNOSIS — D485 Neoplasm of uncertain behavior of skin: Secondary | ICD-10-CM | POA: Diagnosis not present

## 2024-09-21 DIAGNOSIS — D1801 Hemangioma of skin and subcutaneous tissue: Secondary | ICD-10-CM | POA: Diagnosis not present

## 2024-09-21 DIAGNOSIS — L814 Other melanin hyperpigmentation: Secondary | ICD-10-CM | POA: Diagnosis not present

## 2024-09-29 ENCOUNTER — Other Ambulatory Visit: Payer: Self-pay | Admitting: Adult Health

## 2024-09-29 DIAGNOSIS — I480 Paroxysmal atrial fibrillation: Secondary | ICD-10-CM

## 2024-09-29 NOTE — Telephone Encounter (Signed)
 Prescription refill request for Eliquis  received. Indication:afib Last office visit:12/24 Scr:1.02  12/24 Age: 83 Weight:81  kg  Prescription refilled

## 2024-10-04 ENCOUNTER — Other Ambulatory Visit: Payer: Self-pay | Admitting: Cardiovascular Disease

## 2024-10-07 DIAGNOSIS — L905 Scar conditions and fibrosis of skin: Secondary | ICD-10-CM | POA: Diagnosis not present

## 2024-10-07 DIAGNOSIS — D0339 Melanoma in situ of other parts of face: Secondary | ICD-10-CM | POA: Diagnosis not present

## 2024-12-02 ENCOUNTER — Ambulatory Visit (INDEPENDENT_AMBULATORY_CARE_PROVIDER_SITE_OTHER)

## 2024-12-02 DIAGNOSIS — I480 Paroxysmal atrial fibrillation: Secondary | ICD-10-CM

## 2024-12-05 LAB — CUP PACEART REMOTE DEVICE CHECK
Battery Remaining Longevity: 128 mo
Battery Voltage: 3.02 V
Brady Statistic AP VP Percent: 15.84 %
Brady Statistic AP VS Percent: 0 %
Brady Statistic AS VP Percent: 81.91 %
Brady Statistic AS VS Percent: 2.25 %
Brady Statistic RA Percent Paced: 17.19 %
Brady Statistic RV Percent Paced: 97.75 %
Date Time Interrogation Session: 20251226204111
Implantable Lead Connection Status: 753985
Implantable Lead Connection Status: 753985
Implantable Lead Implant Date: 20230320
Implantable Lead Implant Date: 20230320
Implantable Lead Location: 753859
Implantable Lead Location: 753860
Implantable Lead Model: 3830
Implantable Lead Model: 5076
Implantable Pulse Generator Implant Date: 20230320
Lead Channel Impedance Value: 323 Ohm
Lead Channel Impedance Value: 437 Ohm
Lead Channel Impedance Value: 456 Ohm
Lead Channel Impedance Value: 551 Ohm
Lead Channel Pacing Threshold Amplitude: 0.5 V
Lead Channel Pacing Threshold Amplitude: 1 V
Lead Channel Pacing Threshold Pulse Width: 0.4 ms
Lead Channel Pacing Threshold Pulse Width: 0.4 ms
Lead Channel Sensing Intrinsic Amplitude: 25.125 mV
Lead Channel Sensing Intrinsic Amplitude: 25.125 mV
Lead Channel Sensing Intrinsic Amplitude: 4.125 mV
Lead Channel Sensing Intrinsic Amplitude: 4.125 mV
Lead Channel Setting Pacing Amplitude: 1.5 V
Lead Channel Setting Pacing Amplitude: 2.25 V
Lead Channel Setting Pacing Pulse Width: 0.4 ms
Lead Channel Setting Sensing Sensitivity: 0.9 mV
Zone Setting Status: 755011
Zone Setting Status: 755011

## 2024-12-06 ENCOUNTER — Ambulatory Visit: Payer: Self-pay | Admitting: Internal Medicine

## 2024-12-07 ENCOUNTER — Ambulatory Visit: Admitting: Cardiology

## 2024-12-07 ENCOUNTER — Ambulatory Visit: Payer: Self-pay | Admitting: Cardiovascular Disease

## 2024-12-07 ENCOUNTER — Encounter: Payer: Self-pay | Admitting: Pulmonary Disease

## 2024-12-07 VITALS — BP 116/52 | HR 68 | Ht 65.0 in | Wt 168.0 lb

## 2024-12-07 DIAGNOSIS — E7800 Pure hypercholesterolemia, unspecified: Secondary | ICD-10-CM | POA: Diagnosis not present

## 2024-12-07 DIAGNOSIS — I5032 Chronic diastolic (congestive) heart failure: Secondary | ICD-10-CM | POA: Diagnosis not present

## 2024-12-07 DIAGNOSIS — I480 Paroxysmal atrial fibrillation: Secondary | ICD-10-CM | POA: Diagnosis not present

## 2024-12-07 DIAGNOSIS — I442 Atrioventricular block, complete: Secondary | ICD-10-CM | POA: Diagnosis not present

## 2024-12-07 LAB — CUP PACEART INCLINIC DEVICE CHECK
Battery Remaining Longevity: 128 mo
Battery Voltage: 3.02 V
Brady Statistic AP VP Percent: 16.97 %
Brady Statistic AP VS Percent: 0 %
Brady Statistic AS VP Percent: 81.93 %
Brady Statistic AS VS Percent: 1.1 %
Brady Statistic RA Percent Paced: 17.66 %
Brady Statistic RV Percent Paced: 98.9 %
Date Time Interrogation Session: 20251231125654
Implantable Lead Connection Status: 753985
Implantable Lead Connection Status: 753985
Implantable Lead Implant Date: 20230320
Implantable Lead Implant Date: 20230320
Implantable Lead Location: 753859
Implantable Lead Location: 753860
Implantable Lead Model: 3830
Implantable Lead Model: 5076
Implantable Pulse Generator Implant Date: 20230320
Lead Channel Impedance Value: 304 Ohm
Lead Channel Impedance Value: 437 Ohm
Lead Channel Impedance Value: 456 Ohm
Lead Channel Impedance Value: 532 Ohm
Lead Channel Pacing Threshold Amplitude: 0.5 V
Lead Channel Pacing Threshold Amplitude: 1.125 V
Lead Channel Pacing Threshold Pulse Width: 0.4 ms
Lead Channel Pacing Threshold Pulse Width: 0.4 ms
Lead Channel Sensing Intrinsic Amplitude: 25.125 mV
Lead Channel Sensing Intrinsic Amplitude: 25.125 mV
Lead Channel Sensing Intrinsic Amplitude: 4.125 mV
Lead Channel Sensing Intrinsic Amplitude: 4.375 mV
Lead Channel Setting Pacing Amplitude: 1.5 V
Lead Channel Setting Pacing Amplitude: 2.25 V
Lead Channel Setting Pacing Pulse Width: 0.4 ms
Lead Channel Setting Sensing Sensitivity: 0.9 mV
Zone Setting Status: 755011
Zone Setting Status: 755011

## 2024-12-07 NOTE — Progress Notes (Signed)
 Remote PPM Transmission

## 2024-12-07 NOTE — Progress Notes (Signed)
" °  Electrophysiology Office Note:   ID:  Sheri, Moreno 06-May-1941, MRN [id number]  Primary Cardiologist: Jerel Balding, MD Electrophysiologist: Eulas FORBES Furbish, MD      History of Present Illness:   Sheri Moreno is a 83 y.o. female with h/o complete heart block s/p PPM, hypertension, chronic diastolic heart failure, type 2 diabetes mellitus, OSA  seen today for routine electrophysiology followup.   Patient was initially seen by Dr. Waddell in 2023 for evaluation for PPM. Patient had been noticing increased fatigue, dyspnea and was found to be in complete heart block without reversible cause. She underwent PPM implant on 02/24/2022.  Since last being seen in our clinic the patient reports doing well. She walks at least 6000 steps every day, tries to be as active as possible.  she denies chest pain, palpitations, dyspnea, PND, orthopnea, nausea, vomiting, dizziness, syncope, edema, weight gain, or early satiety.   Review of systems complete and found to be negative unless listed in HPI.   EP Information / Studies Reviewed:    EKG is ordered today. Personal review as below.  EKG Interpretation Date/Time:  Wednesday December 07 2024 09:07:02 EST Ventricular Rate:  68 PR Interval:  228 QRS Duration:  96 QT Interval:  432 QTC Calculation: 459 R Axis:   14  Text Interpretation: Atrial-sensed ventricular-paced rhythm with prolonged AV conduction When compared with ECG of 01-Dec-2023 09:56, Vent. rate has decreased BY   3 BPM Confirmed by Trudy [place] [phone]) on 12/07/2024 9:10:14 AM    PPM Interrogation-  reviewed in detail today,  See PACEART report.  Arrhythmia/Device History Medtronic Azure XT DR MRI SureScan implanted 02/24/2022     Physical Exam:   VS:  BP (!) 116/52 (BP Location: Right Arm, Patient Position: Sitting, Cuff Size: Normal)   Pulse 68   Ht 5' 5 (1.651 m)   Wt 168 lb (76.2 kg)   BMI 27.96 kg/m    Wt Readings from Last 3 Encounters:  12/07/24 168  lb (76.2 kg)  12/01/23 178 lb 9.6 oz (81 kg)  07/13/23 172 lb 9.6 oz (78.3 kg)     GEN: No acute distress  NECK: No JVD; No carotid bruits CARDIAC: Regular rate and rhythm, 2/6 systolic murmur RESPIRATORY:  Clear to auscultation without rales, wheezing or rhonchi  ABDOMEN: Soft, non-tender, non-distended EXTREMITIES:  No edema; No deformity   ASSESSMENT AND PLAN:    CHB s/p Medtronic PPM  Normal PPM function. Increased VP, now ~98%. No R waves today VVI 40bpm. Stable impedence and thresholds.  Narrow QRS on ECG today with bundle area pacing. See Elisabeth Art report No changes today.  Paroxysmal atrial fibrillation Secondary hypercoagulable state No episodes of afib noted by PPM since 2024. Continue Eliquis  5mg  BID.  Hypertension Blood pressure is well-controlled on current antihypertensive regimen. Continue current medications and dosing.   HFpEF Patient appears euvolemic in clinic today. Continue Farxiga and Lasix .   Hyperlipidemia Continue Simvastatin .       Disposition:   Follow up with Dr. Furbish in 12 months  Signed, [place] Trudy, PA-C  "

## 2024-12-07 NOTE — Patient Instructions (Addendum)
 Medication Instructions:  NO CHANGES  Lab Work: NONE TO BE DONE TODAY.  Testing/Procedures: NONE  Follow-Up: At [facility], you and your health needs are our priority.  As part of our continuing mission to provide you with exceptional heart care, our providers are all part of one team.  This team includes your primary Cardiologist (physician) and Advanced Practice Providers or APPs (Physician Assistants and Nurse Practitioners) who all work together to provide you with the care you need, when you need it.  Your next appointment:   1 YEAR WITH DR. NANCEY  Provider:   DR. MEALOR

## 2024-12-27 ENCOUNTER — Other Ambulatory Visit: Payer: Self-pay | Admitting: Cardiovascular Disease

## 2025-02-01 ENCOUNTER — Ambulatory Visit: Admitting: Cardiovascular Disease

## 2025-03-03 ENCOUNTER — Encounter

## 2025-06-02 ENCOUNTER — Encounter

## 2025-09-01 ENCOUNTER — Encounter
